# Patient Record
Sex: Female | Born: 1990
Health system: Southern US, Community
[De-identification: ages and names within clinical notes are randomized; demographics above are authoritative.]

## PROBLEM LIST (undated history)

## (undated) DIAGNOSIS — B019 Varicella without complication: Secondary | ICD-10-CM

## (undated) DIAGNOSIS — Z789 Other specified health status: Secondary | ICD-10-CM

## (undated) HISTORY — DX: Varicella without complication: B01.9

## (undated) HISTORY — DX: Other specified health status: Z78.9

---
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
## 2009-06-16 ENCOUNTER — Ambulatory Visit: Payer: Self-pay | Admitting: Otolaryngology

## 2015-03-04 ENCOUNTER — Encounter: Payer: Self-pay | Admitting: Family Medicine

## 2015-03-04 ENCOUNTER — Ambulatory Visit (INDEPENDENT_AMBULATORY_CARE_PROVIDER_SITE_OTHER): Payer: BLUE CROSS/BLUE SHIELD | Admitting: Family Medicine

## 2015-03-04 VITALS — BP 122/74 | HR 92 | Temp 98.5°F | Ht 64.25 in | Wt 197.8 lb

## 2015-03-04 DIAGNOSIS — K219 Gastro-esophageal reflux disease without esophagitis: Secondary | ICD-10-CM | POA: Diagnosis not present

## 2015-03-04 DIAGNOSIS — R0781 Pleurodynia: Secondary | ICD-10-CM | POA: Diagnosis not present

## 2015-03-04 DIAGNOSIS — Z13220 Encounter for screening for lipoid disorders: Secondary | ICD-10-CM

## 2015-03-04 DIAGNOSIS — G43909 Migraine, unspecified, not intractable, without status migrainosus: Secondary | ICD-10-CM | POA: Insufficient documentation

## 2015-03-04 DIAGNOSIS — Z130 Encounter for screening for diseases of the blood and blood-forming organs and certain disorders involving the immune mechanism: Secondary | ICD-10-CM | POA: Diagnosis not present

## 2015-03-04 DIAGNOSIS — Z0000 Encounter for general adult medical examination without abnormal findings: Secondary | ICD-10-CM

## 2015-03-04 DIAGNOSIS — G43709 Chronic migraine without aura, not intractable, without status migrainosus: Secondary | ICD-10-CM

## 2015-03-04 LAB — COMPREHENSIVE METABOLIC PANEL
ALK PHOS: 76 U/L (ref 39–117)
ALT: 12 U/L (ref 0–35)
AST: 13 U/L (ref 0–37)
Albumin: 4.4 g/dL (ref 3.5–5.2)
BILIRUBIN TOTAL: 1 mg/dL (ref 0.2–1.2)
BUN: 10 mg/dL (ref 6–23)
CO2: 26 mEq/L (ref 19–32)
CREATININE: 0.84 mg/dL (ref 0.40–1.20)
Calcium: 9.8 mg/dL (ref 8.4–10.5)
Chloride: 106 mEq/L (ref 96–112)
GFR: 87.21 mL/min (ref >60.00)
GLUCOSE: 77 mg/dL (ref 70–99)
Potassium: 3.8 mEq/L (ref 3.5–5.1)
SODIUM: 138 meq/L (ref 135–145)
TOTAL PROTEIN: 7.4 g/dL (ref 6.0–8.3)

## 2015-03-04 LAB — CBC
HCT: 42.4 % (ref 36.0–46.0)
HEMOGLOBIN: 14.3 g/dL (ref 12.0–15.0)
MCHC: 33.6 g/dL (ref 30.0–36.0)
MCV: 97.1 fl (ref 78.0–100.0)
Platelets: 325 10*3/uL (ref 150.0–400.0)
RBC: 4.36 Mil/uL (ref 3.87–5.11)
RDW: 12.6 % (ref 11.5–15.5)
WBC: 6.9 10*3/uL (ref 4.0–10.5)

## 2015-03-04 LAB — LIPID PANEL
Cholesterol: 152 mg/dL (ref 0–200)
HDL: 51 mg/dL (ref >39.00)
LDL Cholesterol: 92 mg/dL (ref 0–99)
NONHDL: 101.46
Total CHOL/HDL Ratio: 3
Triglycerides: 48 mg/dL (ref 0.0–149.0)
VLDL: 9.6 mg/dL (ref 0.0–40.0)

## 2015-03-04 NOTE — Assessment & Plan Note (Signed)
New problem. Well controlled with Prilosec. Will continue.

## 2015-03-04 NOTE — Progress Notes (Signed)
Subjective:  Patient ID: Debra Wood, female    DOB: April 14, 1990  Age: 24 y.o. MRN: [id number]  CC: Establish care; Rib pain, indigestion  HPI Debra Wood is a 24 y.o. female presents to the clinic today to establish care.  She also has complaints (see below).  Preventative Healthcare  Pap smear: 02/25/15.  Immunizations  Tetanus - 2009.  Pneumococcal - N/A.  Flu - Declined.  Labs: Screening labs today.  Exercise: Regular exercise.  Alcohol use: See below.  Smoking/tobacco use: Nonsmoker.  Regular dental exams: Yes.   Wears seat belt: Yes.  Indigestion  Patient reports recent heartburn and belching.  She reports relief with Prilosec.  No known exacerbating factors.  No associated nausea or vomiting.  No other associated symptoms.  No recent weight loss or other red flags.  Rib pain  Patient states for the past 2-3 weeks she's been experiencing intermittent pain located in the lower ribs.  She states that the pain alternates between right and left sides.  She reports that the pain results in shortness of breath (taking a deep breath worsens it).  Pain is severe and sharp in character.  No relieving factors.  No interventions tried.  She states that it lasts for approximately 1 hour and then resolve spontaneously.  No reported chest pain.  PMH, Surgical Hx, Family Hx, Social History reviewed and updated as below.  Past Medical History  Diagnosis Date  . Chicken pox   . Migraines    Past Surgical History  Procedure Laterality Date  . Tonsillectomy and adenoidectomy  2009   Family History  Problem Relation Age of Onset  . Breast cancer Maternal Grandmother   . Breast cancer Paternal Grandmother    Social History  Substance Use Topics  . Smoking status: Never Smoker   . Smokeless tobacco: Never Used  . Alcohol Use: 1.2 - 1.8 oz/week    2-3 Standard drinks or equivalent per week   Review of Systems  Respiratory: Positive  for shortness of breath.   Cardiovascular:       Chest wall pain - ribs.  All other systems reviewed and are negative.  Objective:   Today's Vitals: BP 122/74 mmHg  Pulse 92  Temp(Src) 98.5 F (36.9 C) (Oral)  Ht 5' 4.25" (1.632 m)  Wt 197 lb 12 oz (89.699 kg)  BMI 33.68 kg/m2  SpO2 98%  Physical Exam  Constitutional: She is oriented to person, place, and time. She appears well-developed and well-nourished. No distress.  HENT:  Head: Normocephalic and atraumatic.  Nose: Nose normal.  Mouth/Throat: Oropharynx is clear and moist. No oropharyngeal exudate.  Normal TM's bilaterally.   Eyes: Conjunctivae are normal. No scleral icterus.  Neck: Neck supple. No thyromegaly present.  Cardiovascular: Normal rate and regular rhythm.   No murmur heard. Pulmonary/Chest: Effort normal and breath sounds normal. She has no wheezes. She has no rales.  Abdominal: Soft. She exhibits no distension. There is no tenderness. There is no rebound and no guarding.  Musculoskeletal: Normal range of motion. She exhibits no edema.  Lymphadenopathy:    She has no cervical adenopathy.  Neurological: She is alert and oriented to person, place, and time.  Skin: Skin is warm and dry. No rash noted.  Psychiatric: She has a normal mood and affect.  Vitals reviewed.  Assessment & Plan:   Problem List Items Addressed This Visit    Rib pain    New problem.  MSK in nature. Exam unremarkable. Advised supportive care  and PRN NSAID's. Offered muscle relaxants and patient declined.      Relevant Orders   Comp Met (CMET) (Completed)   Preventative health care - Primary    Tetanus immunization up-to-date. Flu vaccine declined. Pap smear up-to-date. Screening lab work today - CBC, CMP, Lipid.       Migraine   GERD (gastroesophageal reflux disease)    New problem. Well controlled with Prilosec. Will continue.       Other Visit Diagnoses    Screening, lipid        Relevant Orders    Lipid panel  (Completed)    Screening for deficiency anemia        Relevant Orders    CBC (Completed)      Follow-up: 1 year or sooner if needed  [place]

## 2015-03-04 NOTE — Patient Instructions (Signed)
Continue the prilosec.  Let me know if the pains continue or worsen.  Nothing to be worried about currently.  Follow up annually.  Take care  Dr. Lacinda Axon

## 2015-03-04 NOTE — Assessment & Plan Note (Signed)
New problem.  MSK in nature. Exam unremarkable. Advised supportive care and PRN NSAID's. Offered muscle relaxants and patient declined.

## 2015-03-04 NOTE — Assessment & Plan Note (Addendum)
Tetanus immunization up-to-date. Flu vaccine declined. Pap smear up-to-date. Screening lab work today - CBC, CMP, Lipid.

## 2015-03-04 NOTE — Progress Notes (Signed)
Pre visit review using our clinic review tool, if applicable. No additional management support is needed unless otherwise documented below in the visit note. 

## 2015-04-16 ENCOUNTER — Ambulatory Visit (INDEPENDENT_AMBULATORY_CARE_PROVIDER_SITE_OTHER): Payer: BLUE CROSS/BLUE SHIELD | Admitting: Family Medicine

## 2015-04-16 ENCOUNTER — Encounter: Payer: Self-pay | Admitting: Family Medicine

## 2015-04-16 VITALS — BP 110/78 | HR 89 | Temp 98.7°F | Ht 64.25 in | Wt 202.5 lb

## 2015-04-16 DIAGNOSIS — E669 Obesity, unspecified: Secondary | ICD-10-CM | POA: Diagnosis not present

## 2015-04-16 MED ORDER — PHENTERMINE-TOPIRAMATE ER 3.75-23 MG PO CP24: ORAL_CAPSULE | ORAL | Status: DC

## 2015-04-16 MED ORDER — PHENTERMINE HCL 37.5 MG PO CAPS: 37.5000 mg | ORAL_CAPSULE | ORAL | Status: DC

## 2015-04-16 NOTE — Addendum Note (Signed)
Addended by: Coral Spikes on: 04/16/2015 05:02 PM   Modules accepted: Orders, Medications

## 2015-04-16 NOTE — Assessment & Plan Note (Addendum)
We discussed weight loss options today: Nutrition referral, Diet/exercise, weight loss medication. After discussion of risk/benefits, she elected to try medication. Rx for Phentermine.  Follow up in 1-2 months.

## 2015-04-16 NOTE — Progress Notes (Addendum)
   Subjective:  Patient ID: Debra Wood, female    DOB: Jul 22, 1990  Age: 25 y.o. MRN: [id number]  CC: Discuss weight loss  HPI:  25 year old female presents to discuss weight loss.  Obesity/Weight loss  Patient reports that she is unhappy with her current weight. She states she's gained weight secondary to underlying stressors and job changes.  She would like to lose approximately 50-60 lbs (down to 145-150).  She has been altering her diet and exercising recently (daily cardio; treadmill 30 mins at good pace on incline).   She is seen little to no results with the above interventions.  She states that she had good results with Adipex previously (was given by gyn). She states that she lost approximately 10 pounds in 30 days with this medication.  Patient like to discuss weight loss options today.  Social Hx   Social History   Social History  . Marital Status: Married    Spouse Name: N/A  . Number of Children: N/A  . Years of Education: N/A   Social History Main Topics  . Smoking status: Never Smoker   . Smokeless tobacco: Never Used  . Alcohol Use: 1.2 - 1.8 oz/week    2-3 Standard drinks or equivalent per week  . Drug Use: No  . Sexual Activity: Yes    Birth Control/ Protection: IUD   Other Topics Concern  . None   Social History Narrative   Review of Systems  Constitutional: Negative.   Cardiovascular: Negative.    Objective:  BP 110/78 mmHg  Pulse 89  Temp(Src) 98.7 F (37.1 C) (Oral)  Ht 5' 4.25" (1.632 m)  Wt 202 lb 8 oz (91.853 kg)  BMI 34.49 kg/m2  SpO2 99%  BP/Weight 04/16/2015 [id number]  Systolic BP [id number]  Diastolic BP 78 74  Wt. (Lbs) 202.5 197.75  BMI 34.49 33.68   Physical Exam  Constitutional: She is oriented to person, place, and time. She appears well-developed and well-nourished. No distress.  Eyes: No scleral icterus.  Pulmonary/Chest: Effort normal.  Neurological: She is alert and oriented to person, place, and time.    Psychiatric: She has a normal mood and affect.  Vitals reviewed.  Lab Results  Component Value Date   WBC 6.9 03/04/2015   HGB 14.3 03/04/2015   HCT 42.4 03/04/2015   PLT 325.0 03/04/2015   GLUCOSE 77 03/04/2015   CHOL 152 03/04/2015   TRIG 48.0 03/04/2015   HDL 51.00 03/04/2015   LDLCALC 92 03/04/2015   ALT 12 03/04/2015   AST 13 03/04/2015   NA 138 03/04/2015   K 3.8 03/04/2015   CL 106 03/04/2015   CREATININE 0.84 03/04/2015   BUN 10 03/04/2015   CO2 26 03/04/2015    Assessment & Plan:   Problem List Items Addressed This Visit    Obesity (BMI 30.0-34.9) - Primary    We discussed weight loss options today: Nutrition referral, Diet/exercise, weight loss medication. After discussion of risk/benefits, she elected to try medication. Rx for Phentermine.  Follow up in 1-2 months.      Relevant Medications   phentermine 37.5 MG capsule     Meds ordered this encounter  Medications  . phentermine 37.5 MG capsule    Sig: Take 1 capsule (37.5 mg total) by mouth every morning.    Dispense:  90 capsule    Refill:  0   Follow-up: 1-2 months  Thersa Salt DO [facility]

## 2015-04-16 NOTE — Progress Notes (Signed)
Pre visit review using our clinic review tool, if applicable. No additional management support is needed unless otherwise documented below in the visit note. 

## 2015-04-16 NOTE — Patient Instructions (Signed)
Take the medication as prescribed.  Follow up in 1-2 months.  Take care  Dr. Lacinda Axon

## 2015-06-25 ENCOUNTER — Ambulatory Visit: Payer: BLUE CROSS/BLUE SHIELD | Admitting: Family Medicine

## 2015-07-02 ENCOUNTER — Ambulatory Visit: Payer: BLUE CROSS/BLUE SHIELD | Admitting: Family Medicine

## 2015-07-23 ENCOUNTER — Ambulatory Visit: Payer: BLUE CROSS/BLUE SHIELD | Admitting: Family Medicine

## 2015-10-07 DIAGNOSIS — Z30432 Encounter for removal of intrauterine contraceptive device: Secondary | ICD-10-CM | POA: Diagnosis not present

## 2015-10-15 ENCOUNTER — Telehealth: Payer: Self-pay | Admitting: Family Medicine

## 2015-10-15 ENCOUNTER — Other Ambulatory Visit: Payer: Self-pay | Admitting: Family Medicine

## 2015-10-15 MED ORDER — FEXOFENADINE HCL 180 MG PO TABS: 180.0000 mg | ORAL_TABLET | Freq: Every day | ORAL | 3 refills | Status: DC

## 2015-10-15 NOTE — Telephone Encounter (Signed)
Pt would like to know if you could call in a rx for Allegra D. She said that it is expensive to buy over the counter and she can get a rx cheaper.

## 2015-10-15 NOTE — Telephone Encounter (Signed)
Rx sent 

## 2015-10-15 NOTE — Telephone Encounter (Signed)
Please advise, thanks.

## 2015-10-20 ENCOUNTER — Other Ambulatory Visit: Payer: Self-pay | Admitting: Family Medicine

## 2015-10-20 MED ORDER — FEXOFENADINE-PSEUDOEPHED ER 180-240 MG PO TB24: 1.0000 | ORAL_TABLET | Freq: Every day | ORAL | 0 refills | Status: DC

## 2015-10-20 NOTE — Telephone Encounter (Signed)
Pt states that when she went to pick up this rx that it was only Allegra. She wants to know if Allegra D can be called in so it has the decongestion.

## 2015-10-20 NOTE — Telephone Encounter (Signed)
New Rx sent.

## 2016-07-07 ENCOUNTER — Encounter: Payer: Self-pay | Admitting: Obstetrics & Gynecology

## 2016-10-16 ENCOUNTER — Ambulatory Visit (INDEPENDENT_AMBULATORY_CARE_PROVIDER_SITE_OTHER): Payer: BLUE CROSS/BLUE SHIELD | Admitting: Family Medicine

## 2016-10-16 ENCOUNTER — Encounter: Payer: Self-pay | Admitting: Family Medicine

## 2016-10-16 DIAGNOSIS — L255 Unspecified contact dermatitis due to plants, except food: Secondary | ICD-10-CM | POA: Diagnosis not present

## 2016-10-16 MED ORDER — CLOBETASOL PROP EMOLLIENT BASE 0.05 % EX CREA: 1.0000 "application " | TOPICAL_CREAM | Freq: Two times a day (BID) | CUTANEOUS | 0 refills | Status: DC

## 2016-10-16 NOTE — Patient Instructions (Signed)
Use it twice daily for 1 week.  Should take care of it.  Take care  Dr. Lacinda Axon    Poison Ivy Dermatitis Poison ivy dermatitis is inflammation of the skin that is caused by the allergens on the leaves of the poison ivy plant. The skin reaction often involves redness, swelling, blisters, and extreme itching. What are the causes? This condition is caused by a specific chemical (urushiol) found in the sap of the poison ivy plant. This chemical is sticky and can be easily spread to people, animals, and objects. You can get poison ivy dermatitis by:  Having direct contact with a poison ivy plant.  Touching animals, other people, or objects that have come in contact with poison ivy and have the chemical on them.  What increases the risk? This condition is more likely to develop in:  People who are outdoors often.  People who go outdoors without wearing protective clothing, such as closed shoes, long pants, and a long-sleeved shirt.  What are the signs or symptoms? Symptoms of this condition include:  Redness and itching.  A rash that often includes bumps and blisters. The rash usually appears 48 hours after exposure.  Swelling. This may occur if the reaction is more severe.  Symptoms usually last for 1-2 weeks. However, the first time you develop this condition, symptoms may last 3-4 weeks. How is this diagnosed? This condition may be diagnosed based on your symptoms and a physical exam. Your health care provider may also ask you about any recent outdoor activity. How is this treated? Treatment for this condition will vary depending on how severe it is. Treatment may include:  Hydrocortisone creams or calamine lotions to relieve itching.  Oatmeal baths to soothe the skin.  Over-the-counter antihistamine tablets.  Oral steroid medicine for more severe outbreaks.  Follow these instructions at home:  Take or apply over-the-counter and prescription medicines only as told by your  health care provider.  Wash exposed skin as soon as possible with soap and cold water.  Use hydrocortisone creams or calamine lotion as needed to soothe the skin and relieve itching.  Take oatmeal baths as needed. Use colloidal oatmeal. You can get this at your local pharmacy or grocery store. Follow the instructions on the packaging.  Do not scratch or rub your skin.  While you have the rash, wash clothes right after you wear them. How is this prevented?  Learn to identify the poison ivy plant and avoid contact with the plant. This plant can be recognized by the number of leaves. Generally, poison ivy has three leaves with flowering branches on a single stem. The leaves are typically glossy, and they have jagged edges that come to a point at the front.  If you have been exposed to poison ivy, thoroughly wash with soap and water right away. You have about 30 minutes to remove the plant resin before it will cause the rash. Be sure to wash under your fingernails because any plant resin there will continue to spread the rash.  When hiking or camping, wear clothes that will help you to avoid exposure on the skin. This includes long pants, a long-sleeved shirt, tall socks, and hiking boots. You can also apply preventive lotion to your skin to help limit exposure.  If you suspect that your clothes or outdoor gear came in contact with poison ivy, rinse them off outside with a garden hose before you bring them inside your house. Contact a health care provider if:  You  have open sores in the rash area.  You have more redness, swelling, or pain in the affected area.  You have redness that spreads beyond the rash area.  You have fluid, blood, or pus coming from the affected area.  You have a fever.  You have a rash over a large area of your body.  You have a rash on your eyes, mouth, or genitals.  Your rash does not improve after a few days. Get help right away if:  Your face swells or  your eyes swell shut.  You have trouble breathing.  You have trouble swallowing. This information is not intended to replace advice given to you by your health care provider. Make sure you discuss any questions you have with your health care provider. Document Released: 02/25/2000 Document Revised: 08/05/2015 Document Reviewed: 08/05/2014 Elsevier Interactive Patient Education  [employer].

## 2016-10-16 NOTE — Progress Notes (Signed)
Subjective:  Patient ID: Debra Wood, female    DOB: [birth year]  Age: 26 y.o. MRN: [id number]  CC: Rash  HPI:  26 year old female presents with the above complaint.  Patient states that she has had areas of rash on her lower legs for 2 weeks. She feels that she has poison ivy. She states she has had this previously and feels that is what she is having currently. Located on the medial legs. Red and intensely pruritic. She's had some drainage. Moderate in severity. She's used Benadryl and Caladryl without resolution. No other associated symptoms. No other complaints or concerns at this time.  Social Hx   Social History   Social History  . Marital status: Married    Spouse name: N/A  . Number of children: N/A  . Years of education: N/A   Occupational History  . customer service    Social History Main Topics  . Smoking status: Never Smoker  . Smokeless tobacco: Never Used  . Alcohol use 1.2 - 1.8 oz/week    2 - 3 Standard drinks or equivalent per week  . Drug use: No  . Sexual activity: Yes    Birth control/ protection: IUD   Other Topics Concern  . None   Social History Narrative  . None    Review of Systems  Constitutional: Negative.   Skin: Positive for rash.   Objective:  BP 110/70 (BP Location: Left Arm, Patient Position: Sitting, Cuff Size: Normal)   Pulse 88   Temp 98.8 F (37.1 C) (Oral)   Wt 192 lb (87.1 kg)   SpO2 98%   BMI 32.70 kg/m   BP/Weight 10/16/2016 04/16/2015 43/15/4008  Systolic BP 676 195 093  Diastolic BP 70 78 74  Wt. (Lbs) 192 202.5 197.75  BMI 32.7 34.49 33.68   Physical Exam  Constitutional: She is oriented to person, place, and time. She appears well-developed. No distress.  HENT:  Head: Normocephalic and atraumatic.  Eyes: Conjunctivae are normal. No scleral icterus.  Pulmonary/Chest: Effort normal. No respiratory distress.  Neurological: She is alert and oriented to person, place, and time.  Skin:  Raised patches of  erythema on the medial legs. No drainage.  Psychiatric: She has a normal mood and affect.  Vitals reviewed.   Lab Results  Component Value Date   WBC 6.9 03/04/2015   HGB 14.3 03/04/2015   HCT 42.4 03/04/2015   PLT 325.0 03/04/2015   GLUCOSE 77 03/04/2015   CHOL 152 03/04/2015   TRIG 48.0 03/04/2015   HDL 51.00 03/04/2015   LDLCALC 92 03/04/2015   ALT 12 03/04/2015   AST 13 03/04/2015   NA 138 03/04/2015   K 3.8 03/04/2015   CL 106 03/04/2015   CREATININE 0.84 03/04/2015   BUN 10 03/04/2015   CO2 26 03/04/2015    Assessment & Plan:   Problem List Items Addressed This Visit    Dermatitis due to plants, including poison ivy, sumac, and oak    New problem. Treating with topical clobetasol         Meds ordered this encounter  Medications  . norgestimate-ethinyl estradiol (ORTHO-CYCLEN,SPRINTEC,PREVIFEM) 0.25-35 MG-MCG tablet    Sig: Take 1 tablet by mouth daily.  . fexofenadine (ALLEGRA) 30 MG tablet    Sig: Take 30 mg by mouth 2 (two) times daily.  . Clobetasol Prop Emollient Base (CLOBETASOL PROPIONATE E) 0.05 % emollient cream    Sig: Apply 1 application topically 2 (two) times daily.    Dispense:  30  g    Refill:  0     Follow-up: PRN  [place]

## 2016-10-16 NOTE — Assessment & Plan Note (Signed)
New problem. Treating with topical clobetasol

## 2016-10-24 ENCOUNTER — Other Ambulatory Visit: Payer: Self-pay | Admitting: Family Medicine

## 2016-11-02 DIAGNOSIS — R229 Localized swelling, mass and lump, unspecified: Secondary | ICD-10-CM | POA: Diagnosis not present

## 2017-01-08 ENCOUNTER — Ambulatory Visit (INDEPENDENT_AMBULATORY_CARE_PROVIDER_SITE_OTHER): Payer: BLUE CROSS/BLUE SHIELD | Admitting: Family

## 2017-01-08 ENCOUNTER — Encounter: Payer: Self-pay | Admitting: Family

## 2017-01-08 VITALS — BP 130/70 | HR 78 | Temp 98.0°F | Ht 64.0 in | Wt 191.0 lb

## 2017-01-08 DIAGNOSIS — R300 Dysuria: Secondary | ICD-10-CM | POA: Diagnosis not present

## 2017-01-08 DIAGNOSIS — J302 Other seasonal allergic rhinitis: Secondary | ICD-10-CM

## 2017-01-08 LAB — POCT URINALYSIS DIPSTICK
Blood, UA: NEGATIVE
Glucose, UA: NEGATIVE
NITRITE UA: NEGATIVE
PH UA: 6 (ref 5.0–8.0)
SPEC GRAV UA: 1.02 (ref 1.010–1.025)
Urobilinogen, UA: 1 E.U./dL

## 2017-01-08 LAB — URINALYSIS, MICROSCOPIC ONLY

## 2017-01-08 MED ORDER — FEXOFENADINE-PSEUDOEPHED ER 180-240 MG PO TB24: 1.0000 | ORAL_TABLET | Freq: Every day | ORAL | 1 refills | Status: DC

## 2017-01-08 NOTE — Progress Notes (Signed)
Subjective:    Patient ID: Debra Wood, female    DOB: 16-Mar-1990, 26 y.o.   MRN: [id number]  CC: Debra Wood is a 26 y.o. female who presents today for an acute visit.    HPI: CC: dysuria, urgency x one week, unchanged  No fever, N, V  Tried azo with no relief.   On OCP. no changes in vaginal discharge. No recent UTI   Would like refill of Allegra-D for allergies.no chest pain, palpitations    HISTORY:  Past Medical History:  Diagnosis Date  . Chicken pox   . Migraines    Past Surgical History:  Procedure Laterality Date  . TONSILLECTOMY AND ADENOIDECTOMY  2009   Family History  Problem Relation Age of Onset  . Breast cancer Maternal Grandmother   . Breast cancer Paternal Grandmother     Allergies: Patient has no known allergies. Current Outpatient Prescriptions on File Prior to Visit  Medication Sig Dispense Refill  . Clobetasol Prop Emollient Base (CLOBETASOL PROPIONATE E) 0.05 % emollient cream Apply 1 application topically 2 (two) times daily. 30 g 0  . norgestimate-ethinyl estradiol (ORTHO-CYCLEN,SPRINTEC,PREVIFEM) 0.25-35 MG-MCG tablet Take 1 tablet by mouth daily.     No current facility-administered medications on file prior to visit.     Social History  Substance Use Topics  . Smoking status: Never Smoker  . Smokeless tobacco: Never Used  . Alcohol use 1.2 - 1.8 oz/week    2 - 3 Standard drinks or equivalent per week    Review of Systems  Constitutional: Negative for chills and fever.  Respiratory: Negative for cough.   Cardiovascular: Negative for chest pain and palpitations.  Gastrointestinal: Negative for nausea and vomiting.  Genitourinary: Positive for dysuria and urgency. Negative for flank pain, frequency, hematuria and vaginal discharge.      Objective:    BP 130/70   Pulse 78   Temp 98 F (36.7 C) (Oral)   Ht 5\' 4"  (1.626 m)   Wt 191 lb (86.6 kg)   SpO2 96%   BMI 32.79 kg/m    Physical Exam  Constitutional: She  appears well-developed and well-nourished.  Cardiovascular: Normal rate, regular rhythm, normal heart sounds and normal pulses.   Pulmonary/Chest: Effort normal and breath sounds normal. She has no wheezes. She has no rhonchi. She has no rales.  Abdominal: There is no CVA tenderness.  Neurological: She is alert.  Skin: Skin is warm and dry.  Psychiatric: She has a normal mood and affect. Her speech is normal and behavior is normal. Thought content normal.  Vitals reviewed.      Assessment & Plan:      I have discontinued Ms. Sleeper's fexofenadine and fexofenadine. I am also having her start on fexofenadine-pseudoephedrine. Additionally, I am having her maintain her norgestimate-ethinyl estradiol and Clobetasol Prop Emollient Base.   Meds ordered this encounter  Medications  . fexofenadine-pseudoephedrine (ALLEGRA-D 24) 180-240 MG 24 hr tablet    Sig: Take 1 tablet by mouth daily.    Dispense:  90 tablet    Refill:  1    Order Specific Question:   Supervising Provider    Answer:   Crecencio Mc [[id number]]    Return precautions given.   Risks, benefits, and alternatives of the medications and treatment plan prescribed today were discussed, and patient expressed understanding.   Education regarding symptom management and diagnosis given to patient on AVS.  Continue to follow with Burnard Hawthorne, FNP for routine health maintenance.  Cherlyn Roberts and I agreed with plan.   Mable Paris, FNP

## 2017-01-08 NOTE — Progress Notes (Signed)
Pre visit review using our clinic review tool, if applicable. No additional management support is needed unless otherwise documented below in the visit note. 

## 2017-01-08 NOTE — Assessment & Plan Note (Signed)
Urine dip positive for leukocytes, negative for nitrites, blood. Patient agreeable to wait on urine culture.

## 2017-01-08 NOTE — Assessment & Plan Note (Signed)
control Allegra-D. Refilled

## 2017-01-08 NOTE — Patient Instructions (Addendum)
Let's await on urine culture  Plenty of water  Allegra-D refilled. Please ensure you do not have any palpitations or anxiety on decongestant as can be quite stimulating.

## 2017-01-09 LAB — URINE CULTURE
MICRO NUMBER:: 81209420
SPECIMEN QUALITY:: ADEQUATE

## 2017-01-10 ENCOUNTER — Other Ambulatory Visit: Payer: Self-pay

## 2017-01-10 ENCOUNTER — Telehealth: Payer: Self-pay | Admitting: Family

## 2017-01-10 DIAGNOSIS — J302 Other seasonal allergic rhinitis: Secondary | ICD-10-CM

## 2017-01-10 MED ORDER — FEXOFENADINE-PSEUDOEPHED ER 180-240 MG PO TB24: 1.0000 | ORAL_TABLET | Freq: Every day | ORAL | 1 refills | Status: DC

## 2017-01-10 NOTE — Telephone Encounter (Signed)
Allegra has been sent. Please advise. On other rx.

## 2017-01-10 NOTE — Telephone Encounter (Signed)
Please call pt- Per urine culture, she doesn't have UTI. Per chart, you spoke with her about this?  Does she still have urinary symptoms ?  If so, I would advise rechecking urine and pyridium for discomfort.  Let me know what her symptoms are.

## 2017-01-10 NOTE — Telephone Encounter (Signed)
Pt would like a generic allegra called into her pharmacy. She said the name brand is $150. She also would like an antibiotic sent for her UTI. She said she was told that if it doesn't get better to call and something would be sent in for her. Pt cb [phone]

## 2017-01-11 ENCOUNTER — Other Ambulatory Visit: Payer: Self-pay

## 2017-01-11 NOTE — Telephone Encounter (Signed)
She stated sx are better.

## 2017-02-12 DIAGNOSIS — S63501A Unspecified sprain of right wrist, initial encounter: Secondary | ICD-10-CM | POA: Diagnosis not present

## 2017-04-19 ENCOUNTER — Telehealth: Payer: Self-pay | Admitting: Family

## 2017-04-19 NOTE — Telephone Encounter (Signed)
See message below Tried calling patient back to see if she has tried anything OTC, left message

## 2017-04-19 NOTE — Telephone Encounter (Signed)
Copied from [place]. Topic: Quick Communication - See Telephone Encounter >> Apr 19, 2017 11:52 AM Boyd Kerbs wrote: CRM for notification. See Telephone encounter for:   Called saying co-workers have pink eye and she woke up today with pink and sticky and goopy. She said it in itching and bothering her Wanting to see if can call something in for her.   [facility] [place], [place] - [address] [place] [address] Phone: [phone] Fax: [phone]    04/19/17.

## 2017-04-19 NOTE — Telephone Encounter (Signed)
Pt states her right eye began to water,and became red with itching last night. Upon waking up this morning the pt states her eye was stuck shut and "goop" was around her eye. Pt states that some of her coworkers were diagnosed with pink eye last week. Informed pt that she would need to be seen in for an appt, but the pt states she is unable to come in due to having to work two jobs and not having the time. Informed pt that Urgent Care was also an option to seek treatment. Pt asking if medication could be called in to [facility] in [place] on [address].

## 2017-04-19 NOTE — Telephone Encounter (Signed)
Agree with need for evaluation.  Acute care open until 7:00.

## 2017-04-24 NOTE — Telephone Encounter (Signed)
Patient advised of below 28/19

## 2017-07-03 DIAGNOSIS — M25551 Pain in right hip: Secondary | ICD-10-CM | POA: Diagnosis not present

## 2017-07-03 DIAGNOSIS — M7061 Trochanteric bursitis, right hip: Secondary | ICD-10-CM | POA: Diagnosis not present

## 2017-07-04 DIAGNOSIS — M7061 Trochanteric bursitis, right hip: Secondary | ICD-10-CM | POA: Insufficient documentation

## 2017-07-17 DIAGNOSIS — M25551 Pain in right hip: Secondary | ICD-10-CM | POA: Diagnosis not present

## 2017-07-17 DIAGNOSIS — M7061 Trochanteric bursitis, right hip: Secondary | ICD-10-CM | POA: Diagnosis not present

## 2017-12-22 ENCOUNTER — Other Ambulatory Visit: Payer: Self-pay | Admitting: Family Medicine

## 2018-02-13 ENCOUNTER — Ambulatory Visit: Payer: BLUE CROSS/BLUE SHIELD | Admitting: Family

## 2018-02-13 ENCOUNTER — Ambulatory Visit (INDEPENDENT_AMBULATORY_CARE_PROVIDER_SITE_OTHER): Payer: BLUE CROSS/BLUE SHIELD

## 2018-02-13 ENCOUNTER — Encounter: Payer: Self-pay | Admitting: Family

## 2018-02-13 VITALS — BP 116/70 | HR 83 | Temp 98.6°F | Wt 208.0 lb

## 2018-02-13 DIAGNOSIS — M79671 Pain in right foot: Secondary | ICD-10-CM | POA: Diagnosis not present

## 2018-02-13 DIAGNOSIS — M25562 Pain in left knee: Secondary | ICD-10-CM

## 2018-02-13 DIAGNOSIS — M79672 Pain in left foot: Secondary | ICD-10-CM

## 2018-02-13 NOTE — Progress Notes (Signed)
Subjective:    Patient ID: Debra Wood, female    DOB: 1990/08/23, 27 y.o.   MRN: [id number]  CC: Genisis Wood is a 27 y.o. female who presents today for follow up.   HPI: Complains of left posterior knee pain,  4 months, unchangd.  Feels like swelling behind left knee, painful to bend.   Has had an episode of right leg numbness for 30-40 minutes and then resolved on its. Typically does not have any numbness in legs. No falls or known injury. Doesn't work on knees.   Works on Armed forces training and education officer.  Wears boots to work.  Also complains of bilateral foot pain on soles of feet, couple of months, with improvement.    Noted that soles of feet would bother her when getting out of bed in themorning. Added dr scholls soles which has helped. Wear compression stockings due to swelling at end of day around ankles.   She also complains of hair loss. 'Feels like hair is also falling out more.'  No clumps. Endorses dry skin.   No vision changes.   Overdue pap smear- sees OB GYN , [facility].   On OCP- cycles are irregular. Cramping and heaviness improved on OCP. Trouble conceiving over a year ago, not trying to concieve currently therefore resume OCP.      HISTORY:  Past Medical History:  Diagnosis Date  . Chicken pox   . Migraines    Past Surgical History:  Procedure Laterality Date  . TONSILLECTOMY AND ADENOIDECTOMY  2009   Family History  Problem Relation Age of Onset  . Breast cancer Maternal Grandmother   . Breast cancer Paternal Grandmother     Allergies: Patient has no known allergies. Current Outpatient Medications on File Prior to Visit  Medication Sig Dispense Refill  . Clobetasol Prop Emollient Base (CLOBETASOL PROPIONATE E) 0.05 % emollient cream Apply 1 application topically 2 (two) times daily. 30 g 0  . fexofenadine (ALLEGRA) 180 MG tablet TAKE 1 TABLET (180 MG TOTAL) BY MOUTH DAILY. 90 tablet 2  . fexofenadine-pseudoephedrine (ALLEGRA-D 24) 180-240 MG 24 hr  tablet Take 1 tablet by mouth daily. 90 tablet 1  . norgestimate-ethinyl estradiol (ORTHO-CYCLEN,SPRINTEC,PREVIFEM) 0.25-35 MG-MCG tablet Take 1 tablet by mouth daily.     No current facility-administered medications on file prior to visit.     Social History   Tobacco Use  . Smoking status: Never Smoker  . Smokeless tobacco: Never Used  Substance Use Topics  . Alcohol use: Yes    Alcohol/week: 2.0 - 3.0 standard drinks    Types: 2 - 3 Standard drinks or equivalent per week  . Drug use: No    Review of Systems  Constitutional: Negative for chills and fever.  Respiratory: Negative for cough.   Cardiovascular: Negative for chest pain and palpitations.  Gastrointestinal: Negative for nausea and vomiting.  Musculoskeletal: Positive for arthralgias (left knee) and joint swelling (posterior left knee).      Objective:    BP 116/70 (BP Location: Left Arm, Patient Position: Sitting, Cuff Size: Large)   Pulse 83   Temp 98.6 F (37 C)   Wt 208 lb (94.3 kg)   SpO2 98%   BMI 35.70 kg/m  BP Readings from Last 3 Encounters:  02/13/18 116/70  01/08/17 130/70  10/16/16 110/70   Wt Readings from Last 3 Encounters:  02/13/18 208 lb (94.3 kg)  01/08/17 191 lb (86.6 kg)  10/16/16 192 lb (87.1 kg)    Physical Exam  Constitutional: She  appears well-developed and well-nourished.  Eyes: Conjunctivae are normal.  Cardiovascular: Normal rate, regular rhythm, normal heart sounds and normal pulses.  Pulmonary/Chest: Effort normal and breath sounds normal. She has no wheezes. She has no rhonchi. She has no rales.  Musculoskeletal:       Right knee: She exhibits normal range of motion and no swelling. No tenderness found.       Left knee: She exhibits decreased range of motion. She exhibits no swelling. No tenderness found.       Legs:      Right foot: There is normal range of motion, no tenderness, no bony tenderness and no swelling.       Left foot: There is normal range of motion, no  tenderness and no bony tenderness.  Subtle area of asymetry noted left posterior knee. No increase warmth, erythema. Non fluctuant.  .  Bilateral knees are symmetric. No effusion appreciated. No increase in warmth or erythema. Crepitus felt with flexion of bilateral knees.  Left  knee:   Tenderness with flexion of left knee. Able to fully extend. No catching with McMurray maneuver. No patellar apprehension. Negative anterior drawer and lachman's- no laxity appreciated.  No calf tenderness.  Trace non pitting lower leg edema bilaterally.   Bilateral feet: tenderness of soles of feet noted with plantar flexion. No edema, erythema. Skin intact   Neurological: She is alert.  Skin: Skin is warm and dry.  Psychiatric: She has a normal mood and affect. Her speech is normal and behavior is normal. Thought content normal.  Vitals reviewed.      Assessment & Plan:   Problem List Items Addressed This Visit      Other   Acute pain of left knee - Primary    Pain and assessment support extra-articular source of pain.  Working diagnosis of possible Baker cyst.  Pending left x-ray.  Advised conservative therapy with ice, short course of meloxicam, neoprene sleeve.  If no improvement, patient will let me know we will consult orthopedics.      Relevant Orders   DG Knee Complete 4 Views Left (Completed)   Bilateral foot pain    Working diagnosis of plantar fasciitis.  Pleased that she is already had some improvement.  We jointly agreed we would trial conservative therapy with icing regimen, short course of meloxicam.  If no improvement patient will let me know and we will consult podiatry.      Relevant Orders   CBC with Differential/Platelet (Completed)   TSH (Completed)   Comprehensive metabolic panel (Completed)   Hemoglobin A1c (Completed)   B12 and Folate Panel (Completed)       I am having Erasmo Downer Franze maintain her norgestimate-ethinyl estradiol, Clobetasol Prop Emollient Base,  fexofenadine-pseudoephedrine, and fexofenadine.   No orders of the defined types were placed in this encounter.   Return precautions given.   Risks, benefits, and alternatives of the medications and treatment plan prescribed today were discussed, and patient expressed understanding.   Education regarding symptom management and diagnosis given to patient on AVS.  Continue to follow with Burnard Hawthorne, FNP for routine health maintenance.   Cherlyn Roberts and I agreed with plan.   Mable Paris, FNP

## 2018-02-13 NOTE — Patient Instructions (Addendum)
Call [facility] to schedule follow up - discussion of irregular cycles and have physical, pap smear done.    Suspect plantar fascitis-  ice and mobic as needed.   May take Mobic ( meloxicam) with FOOD since it is an anti-inflammatory. This should be thought of as a temporary medication - around 3 months  If pain has resolved at that time, we need to consult orthopedics/podiatry.

## 2018-02-14 DIAGNOSIS — M25562 Pain in left knee: Secondary | ICD-10-CM | POA: Insufficient documentation

## 2018-02-14 DIAGNOSIS — M79671 Pain in right foot: Secondary | ICD-10-CM | POA: Insufficient documentation

## 2018-02-14 DIAGNOSIS — M79672 Pain in left foot: Secondary | ICD-10-CM

## 2018-02-14 LAB — COMPREHENSIVE METABOLIC PANEL
ALT: 16 U/L (ref 0–35)
AST: 14 U/L (ref 0–37)
Albumin: 4.2 g/dL (ref 3.5–5.2)
Alkaline Phosphatase: 72 U/L (ref 39–117)
BILIRUBIN TOTAL: 0.5 mg/dL (ref 0.2–1.2)
BUN: 10 mg/dL (ref 6–23)
CO2: 26 mEq/L (ref 19–32)
CREATININE: 0.94 mg/dL (ref 0.40–1.20)
Calcium: 9.1 mg/dL (ref 8.4–10.5)
Chloride: 104 mEq/L (ref 96–112)
GFR: 74.93 mL/min (ref >60.00)
Glucose, Bld: 91 mg/dL (ref 70–99)
Potassium: 4.2 mEq/L (ref 3.5–5.1)
SODIUM: 138 meq/L (ref 135–145)
Total Protein: 7.3 g/dL (ref 6.0–8.3)

## 2018-02-14 LAB — CBC WITH DIFFERENTIAL/PLATELET
BASOS PCT: 0.7 % (ref 0.0–3.0)
Basophils Absolute: 0.1 10*3/uL (ref 0.0–0.1)
EOS ABS: 0.2 10*3/uL (ref 0.0–0.7)
Eosinophils Relative: 1.8 % (ref 0.0–5.0)
HEMATOCRIT: 41.3 % (ref 36.0–46.0)
Hemoglobin: 14.1 g/dL (ref 12.0–15.0)
LYMPHS ABS: 3.1 10*3/uL (ref 0.7–4.0)
LYMPHS PCT: 34.9 % (ref 12.0–46.0)
MCHC: 34.2 g/dL (ref 30.0–36.0)
MCV: 95.4 fl (ref 78.0–100.0)
MONOS PCT: 6.3 % (ref 3.0–12.0)
Monocytes Absolute: 0.6 10*3/uL (ref 0.1–1.0)
Neutro Abs: 5 10*3/uL (ref 1.4–7.7)
Neutrophils Relative %: 56.3 % (ref 43.0–77.0)
PLATELETS: 352 10*3/uL (ref 150.0–400.0)
RBC: 4.33 Mil/uL (ref 3.87–5.11)
RDW: 12.6 % (ref 11.5–15.5)
WBC: 8.8 10*3/uL (ref 4.0–10.5)

## 2018-02-14 LAB — TSH: TSH: 0.37 u[IU]/mL (ref 0.35–4.50)

## 2018-02-14 LAB — B12 AND FOLATE PANEL
FOLATE: 23.2 ng/mL (ref >5.9)
Vitamin B-12: 439 pg/mL (ref 211–911)

## 2018-02-14 LAB — HEMOGLOBIN A1C: Hgb A1c MFr Bld: 4.9 % (ref 4.6–6.5)

## 2018-02-14 NOTE — Assessment & Plan Note (Signed)
Working diagnosis of plantar fasciitis.  Pleased that she is already had some improvement.  We jointly agreed we would trial conservative therapy with icing regimen, short course of meloxicam.  If no improvement patient will let me know and we will consult podiatry.

## 2018-02-14 NOTE — Assessment & Plan Note (Signed)
Pain and assessment support extra-articular source of pain.  Working diagnosis of possible Baker cyst.  Pending left x-ray.  Advised conservative therapy with ice, short course of meloxicam, neoprene sleeve.  If no improvement, patient will let me know we will consult orthopedics.

## 2018-02-28 ENCOUNTER — Ambulatory Visit (INDEPENDENT_AMBULATORY_CARE_PROVIDER_SITE_OTHER): Payer: BLUE CROSS/BLUE SHIELD | Admitting: Obstetrics and Gynecology

## 2018-02-28 ENCOUNTER — Encounter: Payer: Self-pay | Admitting: Obstetrics and Gynecology

## 2018-02-28 ENCOUNTER — Other Ambulatory Visit (HOSPITAL_COMMUNITY)
Admission: RE | Admit: 2018-02-28 | Discharge: 2018-02-28 | Disposition: A | Discharge status: Home / Self Care | Payer: BLUE CROSS/BLUE SHIELD | Source: Ambulatory Visit | Attending: Obstetrics and Gynecology | Admitting: Obstetrics and Gynecology

## 2018-02-28 VITALS — BP 131/76 | HR 84 | Ht 64.0 in | Wt 208.0 lb

## 2018-02-28 DIAGNOSIS — R635 Abnormal weight gain: Secondary | ICD-10-CM | POA: Diagnosis not present

## 2018-02-28 DIAGNOSIS — Z1239 Encounter for other screening for malignant neoplasm of breast: Secondary | ICD-10-CM

## 2018-02-28 DIAGNOSIS — Z01419 Encounter for gynecological examination (general) (routine) without abnormal findings: Secondary | ICD-10-CM

## 2018-02-28 DIAGNOSIS — N939 Abnormal uterine and vaginal bleeding, unspecified: Secondary | ICD-10-CM | POA: Diagnosis not present

## 2018-02-28 DIAGNOSIS — Z124 Encounter for screening for malignant neoplasm of cervix: Secondary | ICD-10-CM | POA: Insufficient documentation

## 2018-02-28 NOTE — Progress Notes (Signed)
Gynecology Annual Exam   PCP: Burnard Hawthorne, FNP  Chief Complaint:  Chief Complaint  Patient presents with  . Gynecologic Exam    DIscuss PCOS    History of Present Illness: Patient is a 27 y.o. No obstetric history on file. presents for annual exam. The patient has no complaints today.   LMP: Patient's last menstrual period was 02/13/2018 (exact date). Average Interval: regular, 28 days Duration of flow: 5 days Heavy Menses: no Clots: no Intermenstrual Bleeding: no Postcoital Bleeding: no Dysmenorrhea: no  The patient is sexually active. She currently uses OCP (estrogen/progesterone) for contraception. She denies dyspareunia.  There is no notable family history of breast or ovarian cancer in her family.  The patient wears seatbelts: yes.   The patient has regular exercise: not asked.    The patient denies current symptoms of depression.    Review of Systems: Review of Systems  Constitutional: Positive for malaise/fatigue. Negative for chills, fever and weight loss.  HENT: Negative for congestion.   Respiratory: Negative for cough and shortness of breath.   Cardiovascular: Negative for chest pain and palpitations.  Gastrointestinal: Negative for abdominal pain, constipation, diarrhea, heartburn, nausea and vomiting.  Genitourinary: Negative for dysuria, frequency and urgency.  Skin: Negative for itching and rash.  Neurological: Negative for dizziness and headaches.  Endo/Heme/Allergies: Negative for polydipsia.  Psychiatric/Behavioral: Negative for depression.    Past Medical History:  Past Medical History:  Diagnosis Date  . Chicken pox   . No known health problems     Past Surgical History:  Past Surgical History:  Procedure Laterality Date  . TONSILLECTOMY AND ADENOIDECTOMY  2009    Gynecologic History:  Patient's last menstrual period was 02/13/2018 (exact date). Contraception: OCP (estrogen/progesterone)   Obstetric History: No obstetric  history on file.  Family History:  Family History  Problem Relation Age of Onset  . Breast cancer Maternal Grandmother        40's-50's pt unsure  . Breast cancer Paternal Grandmother 72       had 2 seperate times    Social History:  Social History   Socioeconomic History  . Marital status: Married    Spouse name: Not on file  . Number of children: Not on file  . Years of education: Not on file  . Highest education level: Not on file  Occupational History  . Occupation: Therapist, art  Social Needs  . Financial resource strain: Not on file  . Food insecurity:    Worry: Not on file    Inability: Not on file  . Transportation needs:    Medical: Not on file    Non-medical: Not on file  Tobacco Use  . Smoking status: Never Smoker  . Smokeless tobacco: Never Used  Substance and Sexual Activity  . Alcohol use: Yes    Alcohol/week: 2.0 - 3.0 standard drinks    Types: 2 - 3 Standard drinks or equivalent per week  . Drug use: No  . Sexual activity: Yes    Birth control/protection: Pill  Lifestyle  . Physical activity:    Days per week: Not on file    Minutes per session: Not on file  . Stress: Not on file  Relationships  . Social connections:    Talks on phone: Not on file    Gets together: Not on file    Attends religious service: Not on file    Active member of club or organization: Not on file  Attends meetings of clubs or organizations: Not on file    Relationship status: Not on file  . Intimate partner violence:    Fear of current or ex partner: Not on file    Emotionally abused: Not on file    Physically abused: Not on file    Forced sexual activity: Not on file  Other Topics Concern  . 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 Authorizing Provider  norgestimate-ethinyl estradiol  (ORTHO-CYCLEN,SPRINTEC,PREVIFEM) 0.25-35 MG-MCG tablet Take 1 tablet by mouth daily.   Yes [provider]    Physical Exam Vitals: Blood pressure 131/76, pulse 84, height 5\' 4"  (1.626 m), weight 208 lb (94.3 kg), last menstrual period 02/13/2018.  General: NAD HEENT: normocephalic, anicteric Thyroid: no enlargement, no palpable nodules Pulmonary: No increased work of breathing, CTAB Cardiovascular: RRR, distal pulses 2+ Breast: Breast symmetrical, no tenderness, no palpable nodules or masses, no skin or nipple retraction present, no nipple discharge.  No axillary or supraclavicular lymphadenopathy. Abdomen: NABS, soft, non-tender, non-distended.  Umbilicus without lesions.  No hepatomegaly, splenomegaly or masses palpable. No evidence of hernia  Genitourinary:  External: Normal external female genitalia.  Normal urethral meatus, normal Bartholin's and Skene's glands.    Vagina: Normal vaginal mucosa, no evidence of prolapse.    Cervix: Grossly normal in appearance, no bleeding  Uterus: Non-enlarged, mobile, normal contour.  No CMT  Adnexa: ovaries non-enlarged, no adnexal masses  Rectal: deferred  Lymphatic: no evidence of inguinal lymphadenopathy Extremities: no edema, erythema, or tenderness Neurologic: Grossly intact Psychiatric: mood appropriate, affect full  Female chaperone present for pelvic and breast  portions of the physical exam    Assessment: 27 y.o. No obstetric history on file. routine annual exam  Plan: Problem List Items Addressed This Visit    None    Visit Diagnoses    Encounter for gynecological examination without abnormal finding    -  Primary   Screening for malignant neoplasm of cervix       Relevant Orders   Cytology - PAP (Completed)   Breast screening       Weight gain       Relevant Orders   TSH+Prl+FSH+TestT+LH+DHEA S... (Completed)   US Transvaginal Non-OB   Abnormal uterine bleeding       Relevant Orders    TSH+Prl+FSH+TestT+LH+DHEA S... (Completed)   US Transvaginal Non-OB     1) Irregular menses off OCP - has tried conceiving in past unsuccessfully.  Would like to be checked for PCOS  2) STI screening  was notoffered and therefore not obtained  2)  ASCCP guidelines and rational discussed.  Patient opts for every 3 years screening interval  3) Contraception - the patient is currently using  OCP (estrogen/progesterone).  She is happy with her current form of contraception and plans to continue  4) Routine healthcare maintenance including cholesterol, diabetes screening discussed managed by PCP  5) Return in about 1 week (around 03/07/2018) for 1-3 week TVUS and follow up.   Malachy Mood, MD, [facility], [facility] 02/28/2018, 3:03 PM

## 2018-03-04 LAB — CYTOLOGY - PAP: Diagnosis: NEGATIVE

## 2018-03-05 LAB — TSH+PRL+FSH+TESTT+LH+DHEA S...
17-Hydroxyprogesterone: <10 ng/dL
Androstenedione: 49 ng/dL (ref 41–262)
DHEA SO4: 99.3 ug/dL (ref 84.8–378.0)
FSH: 5.6 m[IU]/mL
LH: 7 m[IU]/mL
Prolactin: 33.6 ng/mL — ABNORMAL HIGH (ref 4.8–23.3)
TSH: 0.305 u[IU]/mL — ABNORMAL LOW (ref 0.450–4.500)
Testosterone, Free: 0.6 pg/mL (ref 0.0–4.2)
Testosterone: 6 ng/dL — ABNORMAL LOW (ref 8–48)

## 2018-03-25 ENCOUNTER — Telehealth: Payer: Self-pay | Admitting: Family

## 2018-03-25 ENCOUNTER — Encounter: Payer: Self-pay | Admitting: Obstetrics and Gynecology

## 2018-03-25 ENCOUNTER — Ambulatory Visit (INDEPENDENT_AMBULATORY_CARE_PROVIDER_SITE_OTHER): Payer: BLUE CROSS/BLUE SHIELD | Admitting: Obstetrics and Gynecology

## 2018-03-25 ENCOUNTER — Ambulatory Visit (INDEPENDENT_AMBULATORY_CARE_PROVIDER_SITE_OTHER): Payer: BLUE CROSS/BLUE SHIELD

## 2018-03-25 VITALS — BP 130/76 | HR 100 | Wt 206.0 lb

## 2018-03-25 DIAGNOSIS — N939 Abnormal uterine and vaginal bleeding, unspecified: Secondary | ICD-10-CM | POA: Diagnosis not present

## 2018-03-25 DIAGNOSIS — R635 Abnormal weight gain: Secondary | ICD-10-CM | POA: Diagnosis not present

## 2018-03-25 DIAGNOSIS — R7989 Other specified abnormal findings of blood chemistry: Secondary | ICD-10-CM

## 2018-03-25 DIAGNOSIS — E669 Obesity, unspecified: Secondary | ICD-10-CM

## 2018-03-25 DIAGNOSIS — Z23 Encounter for immunization: Secondary | ICD-10-CM | POA: Diagnosis not present

## 2018-03-25 DIAGNOSIS — E229 Hyperfunction of pituitary gland, unspecified: Secondary | ICD-10-CM | POA: Diagnosis not present

## 2018-03-25 MED ORDER — METFORMIN HCL 500 MG PO TABS: ORAL_TABLET | ORAL | 2 refills | Status: DC

## 2018-03-25 NOTE — Telephone Encounter (Signed)
Patient notified & advised of below.

## 2018-03-25 NOTE — Telephone Encounter (Signed)
Pt would like to know if Joycelyn Schmid can look at the ultrasound pics that was performed at [facility] today (03/25/18) so she can give her opinion on it. Pt cb [phone]

## 2018-03-25 NOTE — Progress Notes (Signed)
Gynecology Ultrasound Follow Up  Chief Complaint:  Chief Complaint  Patient presents with  . Follow-up    GYN Ultrasound     History of Present Illness: Patient is a 28 y.o. female who presents today for ultrasound evaluation of AUB-O .  Ultrasound demonstrates the following findgins Adnexa: normal size and consistence, no evidence of PCOS Uterus: Non-enlarged with endometrial stripe non-thickened without focal abnormaliteis Additional: no free fluid  Review of Systems: Review of Systems  Constitutional: Negative.   Gastrointestinal: Negative.   Genitourinary: Negative.   Neurological: Negative.     Past Medical History:  Past Medical History:  Diagnosis Date  . Chicken pox   . No known health problems     Past Surgical History:  Past Surgical History:  Procedure Laterality Date  . TONSILLECTOMY AND ADENOIDECTOMY  2009    Gynecologic History:  Patient's last menstrual period was 03/12/2018 (exact date). Contraception: none Last Pap: 02/28/2018 Results were: .no abnormalities  Family History:  Family History  Problem Relation Age of Onset  . Breast cancer Maternal Grandmother        40's-50's pt unsure  . Breast cancer Paternal Grandmother 31       had 2 seperate times    Social History:  Social History   Socioeconomic History  . Marital status: Married    Spouse name: Not on file  . Number of children: Not on file  . Years of education: Not on file  . Highest education level: Not on file  Occupational History  . Occupation: Therapist, art  Social Needs  . Financial resource strain: Not on file  . Food insecurity:    Worry: Not on file    Inability: Not on file  . Transportation needs:    Medical: Not on file    Non-medical: Not on file  Tobacco Use  . Smoking status: Never Smoker  . Smokeless tobacco: Never Used  Substance and Sexual Activity  . Alcohol use: Yes    Alcohol/week: 2.0 - 3.0 standard drinks    Types: 2 - 3 Standard  drinks or equivalent per week  . Drug use: No  . Sexual activity: Yes    Birth control/protection: Pill  Lifestyle  . Physical activity:    Days per week: Not on file    Minutes per session: Not on file  . Stress: Not on file  Relationships  . Social connections:    Talks on phone: Not on file    Gets together: Not on file    Attends religious service: Not on file    Active member of club or organization: Not on file    Attends meetings of clubs or organizations: Not on file    Relationship status: Not on file  . Intimate partner violence:    Fear of current or ex partner: Not on file    Emotionally abused: Not on file    Physically abused: Not on file    Forced sexual activity: Not on file  Other Topics Concern  . 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 Authorizing Provider  fexofenadine (ALLEGRA) 180 MG tablet fexofenadine 180 mg tablet  TAKE 1 TABLET (180 MG TOTAL) BY MOUTH DAILY.   Yes [provider]  norgestimate-ethinyl estradiol (ORTHO-CYCLEN,SPRINTEC,PREVIFEM) 0.25-35 MG-MCG tablet Take 1 tablet by mouth daily.  Yes [provider]  metFORMIN (GLUCOPHAGE) 500 MG tablet Take one tablet by mouth daily for one week. Then increase to one tablet twice a day for one week.  Then two tablets twice a day. 03/25/18   Malachy Mood, MD    Physical Exam Vitals: Blood pressure 130/76, pulse 100, weight 206 lb (93.4 kg), last menstrual period 03/12/2018. Body mass index is 35.36 kg/m.   General: NAD HEENT: normocephalic, anicteric Pulmonary: No increased work of breathing Neurologic: Grossly intact, normal gait Psychiatric: mood appropriate, affect full  US Transvaginal Non-ob  Result Date: 03/25/2018 Patient Name: Debra Wood DOB: 1990-10-05 MRN: [id number] ULTRASOUND REPORT Location:  [facility] Date of Service: 03/25/2018 Indications:AUB Findings: The uterus is anteverted and measures 8.8 x 4.9 x 3.1cm. Echo texture is homogenous without evidence of focal masses. The Endometrium measures 4.9 mm. Right Ovary measures 2.1 x 2.4 x 2.0cm. It is normal in appearance. Left Ovary measures 3.0 x 2.3 x 1.1cm. It is normal in appearance. Survey of the adnexa demonstrates no adnexal masses. Trace amount of fluid within the cervical canal. Impression: 1. Normal gyn ultrasound. Recommendations: 1.Clinical correlation with the patient's History and Physical Exam. Vita Barley, RDMS RVT Images reviewed.  Normal GYN study without visualized pathology.  Specifically, the ovaries do not have an appearance that would be suggestive of polycystic ovarian syndrome. Malachy Mood, MD, [facility], [facility] 03/25/2018, 1:06 PM     Assessment: 28 y.o. No obstetric history on file. No problem-specific Assessment & Plan notes found for this encounter.   Plan: Problem List Items Addressed This Visit    None    Visit Diagnoses    Need for Tdap vaccination    -  Primary   Relevant Orders   Tdap vaccine greater than or equal to 7yo IM (Completed)   Low TSH level       Relevant Orders   Thyroid Panel With TSH   Abnormal uterine bleeding (AUB)       Relevant Orders   Thyroid Panel With TSH   Prolactin   Elevated prolactin level (HCC)       Relevant Orders   Prolactin      1) AUB-O - anovulatory bleeding pattern and infertility.  Laboratory results reviewed and other than mild depressed TSH and mildly elevated prolactin not compatible with diagnosis of PCOS.  Ultrasound today also does not appear to support diagnosis of PCOS.  We discussed options for maximizing ovulatory cycles including weight loss vs starting letrozole or clomid.  At present patient is interested in pursuing weight loss.  She is aware while this may improve overall ovulatory function it may not  necessitate the need for ovulation induction agents.  It does optimize her pregnancy and decrease risk of pregnancy complications.  Patient is most interested in starting a trial of metformin and we discussed that this is generally considered off label but has some evidence in infertility associated with PCOS.  If patient does well with metformin consideration to starting a stimulant weight loss medication such as phentermine. - start metformin 500mg  po bid - Thyroid panel - Repeat prolactin measurement  2) A total of 20 minutes were spent in face-to-face contact with the patient during this encounter with over half of that time devoted to counseling and coordination of care.  Ultrasound images were independently reviewed.  3) Return in about 4 weeks (around 04/22/2018) for medication follow up.  Malachy Mood, MD, [facility], [facility] 03/25/2018, 7:00 PM

## 2018-03-25 NOTE — Telephone Encounter (Signed)
Call patient We can actually able to see her ultrasound report since in Epic.    I would defer to OB/GYN in regards to her interpretation.  It appears normal ultrasound; Dr Georgianne Fick didn't suspect PCOS.    Sorry I wish I be of more help however OB/GYN reviewed vaginal ultrasounds multiple times a day.

## 2018-03-26 LAB — PROLACTIN: PROLACTIN: 44.4 ng/mL — AB (ref 4.8–23.3)

## 2018-03-26 LAB — THYROID PANEL WITH TSH
FREE THYROXINE INDEX: 2 (ref 1.2–4.9)
T3 UPTAKE RATIO: 18 % — AB (ref 24–39)
T4, Total: 11.1 ug/dL (ref 4.5–12.0)
TSH: 0.363 u[IU]/mL — ABNORMAL LOW (ref 0.450–4.500)

## 2018-04-02 DIAGNOSIS — H16143 Punctate keratitis, bilateral: Secondary | ICD-10-CM | POA: Diagnosis not present

## 2018-04-02 DIAGNOSIS — H18823 Corneal disorder due to contact lens, bilateral: Secondary | ICD-10-CM | POA: Diagnosis not present

## 2018-04-02 DIAGNOSIS — H10413 Chronic giant papillary conjunctivitis, bilateral: Secondary | ICD-10-CM | POA: Diagnosis not present

## 2018-04-03 ENCOUNTER — Other Ambulatory Visit: Payer: Self-pay | Admitting: Obstetrics and Gynecology

## 2018-04-03 DIAGNOSIS — E221 Hyperprolactinemia: Secondary | ICD-10-CM

## 2018-04-03 DIAGNOSIS — R51 Headache: Secondary | ICD-10-CM

## 2018-04-03 DIAGNOSIS — R519 Headache, unspecified: Secondary | ICD-10-CM

## 2018-04-03 NOTE — Addendum Note (Signed)
Addended by: Dorthula Nettles on: 04/03/2018 02:22 PM   Modules accepted: Orders

## 2018-04-09 DIAGNOSIS — H10413 Chronic giant papillary conjunctivitis, bilateral: Secondary | ICD-10-CM | POA: Diagnosis not present

## 2018-04-09 DIAGNOSIS — H18823 Corneal disorder due to contact lens, bilateral: Secondary | ICD-10-CM | POA: Diagnosis not present

## 2018-04-09 DIAGNOSIS — H16143 Punctate keratitis, bilateral: Secondary | ICD-10-CM | POA: Diagnosis not present

## 2018-04-10 ENCOUNTER — Ambulatory Visit
Admission: RE | Admit: 2018-04-10 | Discharge: 2018-04-10 | Disposition: A | Discharge status: Home / Self Care | Payer: BLUE CROSS/BLUE SHIELD | Source: Ambulatory Visit | Attending: Obstetrics and Gynecology | Admitting: Obstetrics and Gynecology

## 2018-04-10 DIAGNOSIS — H539 Unspecified visual disturbance: Secondary | ICD-10-CM | POA: Diagnosis not present

## 2018-04-10 DIAGNOSIS — R51 Headache: Secondary | ICD-10-CM | POA: Diagnosis not present

## 2018-04-10 DIAGNOSIS — R519 Headache, unspecified: Secondary | ICD-10-CM

## 2018-04-10 MED ORDER — GADOBUTROL 1 MMOL/ML IV SOLN
9.0000 mL | Freq: Once | INTRAVENOUS | Status: AC | PRN
  Administered 2018-04-10: 9 mL via INTRAVENOUS

## 2018-04-11 ENCOUNTER — Other Ambulatory Visit: Payer: Self-pay | Admitting: Obstetrics and Gynecology

## 2018-04-11 DIAGNOSIS — E221 Hyperprolactinemia: Secondary | ICD-10-CM

## 2018-04-11 DIAGNOSIS — E236 Other disorders of pituitary gland: Secondary | ICD-10-CM

## 2018-04-11 DIAGNOSIS — N970 Female infertility associated with anovulation: Secondary | ICD-10-CM

## 2018-04-19 ENCOUNTER — Encounter: Payer: Self-pay | Admitting: Family

## 2018-04-22 ENCOUNTER — Ambulatory Visit: Payer: BLUE CROSS/BLUE SHIELD | Admitting: Obstetrics and Gynecology

## 2018-04-23 ENCOUNTER — Encounter: Payer: Self-pay | Admitting: Family Medicine

## 2018-04-23 ENCOUNTER — Ambulatory Visit: Payer: BLUE CROSS/BLUE SHIELD | Admitting: Family Medicine

## 2018-04-23 DIAGNOSIS — L237 Allergic contact dermatitis due to plants, except food: Secondary | ICD-10-CM | POA: Diagnosis not present

## 2018-04-23 DIAGNOSIS — R51 Headache: Secondary | ICD-10-CM | POA: Diagnosis not present

## 2018-04-23 DIAGNOSIS — E236 Other disorders of pituitary gland: Secondary | ICD-10-CM | POA: Diagnosis not present

## 2018-04-23 MED ORDER — PREDNISONE 20 MG PO TABS: ORAL_TABLET | ORAL | 0 refills | Status: DC

## 2018-04-23 NOTE — Progress Notes (Signed)
  Tommi Rumps, MD Phone: [phone]  Debra Wood is a 28 y.o. female who presents today for same-day visit.  CC: Rash  Rash: Patient notes onset yesterday.  She believes it is related to poison ivy.  Her nephews got into this outside and she touched some of their clothing.  Started on her right hand and then developed on her left neck and now onto her right cheek.  It is very itchy.  She is had no fevers.  No changes in medications, soaps, or detergent.  She has tried calamine, a scrub from the pharmacy, and Benadryl with little benefit.  She has a history of this in the past and typically would require a prednisone taper.  Social History   Tobacco Use  Smoking Status Never Smoker  Smokeless Tobacco Never Used     ROS see history of present illness  Objective  Physical Exam Vitals:   04/23/18 1514  BP: 114/80  Pulse: (!) 108  Temp: 97.8 F (36.6 C)  SpO2: 96%    BP Readings from Last 3 Encounters:  04/23/18 114/80  03/25/18 130/76  02/28/18 131/76   Wt Readings from Last 3 Encounters:  04/23/18 205 lb 12 oz (93.3 kg)  03/25/18 206 lb (93.4 kg)  02/28/18 208 lb (94.3 kg)    Physical Exam Constitutional:      General: She is not in acute distress.    Appearance: She is not diaphoretic.  Cardiovascular:     Rate and Rhythm: Normal rate and regular rhythm.     Heart sounds: Normal heart sounds.  Pulmonary:     Effort: Pulmonary effort is normal.  Skin:    General: Skin is warm and dry.  Neurological:     Mental Status: She is alert.      Papulovesicular rash on right hand and left neck underneath jawline, similar smaller rash on right cheek    Assessment/Plan: Please see individual problem list.  Poison ivy dermatitis Rash most consistent with poison ivy dermatitis.  Given that it is on her face we will treat with a steroid taper.  Discussed possible agitation, sleep difficulty, and increased appetite with steroid.  She was encouraged to wash  all clothes and sheets that have been used.  If not improving she will let us know.  If worsening she will let us know.  Given return precautions.   No orders of the defined types were placed in this encounter.   Meds ordered this encounter  Medications  . predniSONE (DELTASONE) 20 MG tablet    Sig: Take 40 mg (2 tablets) by mouth daily for 7 days, then take 20 mg (1 tablet) by mouth daily for 4 days, then take 10 mg (half a tablet) by mouth daily for 4 days    Dispense:  20 tablet    Refill:  0     Tommi Rumps, MD [facility]

## 2018-04-23 NOTE — Patient Instructions (Signed)
Nice to see you. We will treated with a prednisone taper for your poison ivy. If this worsens or you develop issues with getting into your eyes or into your mouth or throat please be evaluated again. You can try over-the-counter Zyrtec or Claritin to help with itching.

## 2018-04-23 NOTE — Assessment & Plan Note (Addendum)
Rash most consistent with poison ivy dermatitis.  Given that it is on her face we will treat with a steroid taper.  Discussed possible agitation, sleep difficulty, and increased appetite with steroid.  She was encouraged to wash all clothes and sheets that have been used.  If not improving she will let us know.  If worsening she will let us know.  Given return precautions.

## 2018-04-24 ENCOUNTER — Ambulatory Visit (INDEPENDENT_AMBULATORY_CARE_PROVIDER_SITE_OTHER): Payer: BLUE CROSS/BLUE SHIELD | Admitting: Obstetrics and Gynecology

## 2018-04-24 ENCOUNTER — Encounter: Payer: Self-pay | Admitting: Obstetrics and Gynecology

## 2018-04-24 VITALS — BP 138/80 | Ht 64.0 in | Wt 207.0 lb

## 2018-04-24 DIAGNOSIS — Z6835 Body mass index (BMI) 35.0-35.9, adult: Secondary | ICD-10-CM

## 2018-04-24 DIAGNOSIS — E669 Obesity, unspecified: Secondary | ICD-10-CM | POA: Diagnosis not present

## 2018-04-24 MED ORDER — PHENTERMINE HCL 37.5 MG PO TABS: 37.5000 mg | ORAL_TABLET | Freq: Every day | ORAL | 0 refills | Status: DC

## 2018-04-24 NOTE — Progress Notes (Signed)
Gynecology Office Visit  Chief Complaint:  Chief Complaint  Patient presents with  . Follow-up    History of Present Illness: Patientis a 28 y.o. No obstetric history on file. female, who presents for the evaluation of the desire to lose weight. She has gained 1 lbs since last visit The patient states the following symptoms since starting her significant GI upset after the first week which caused her to self discontinue metformin.  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     Review of Systems: 10 point review of systems negative unless otherwise noted in HPI  Past Medical History:  Past Medical History:  Diagnosis Date  . Chicken pox   . No known health problems     Past Surgical History:  Past Surgical History:  Procedure Laterality Date  . TONSILLECTOMY AND ADENOIDECTOMY  2009    Gynecologic History: Patient's last menstrual period was 04/07/2018.  Obstetric History: No obstetric history on file.  Family History:  Family History  Problem Relation Age of Onset  . Breast cancer Maternal Grandmother        40's-50's pt unsure  . Breast cancer Paternal Grandmother 78       had 2 seperate times    Social History:  Social History   Socioeconomic History  . Marital status: Married    Spouse name: Not on file  . Number of children: Not on file  . Years of education: Not on file  . Highest education level: Not on file  Occupational History  . Occupation: Therapist, art  Social Needs  . Financial resource strain: Not on file  . Food insecurity:    Worry: Not on file    Inability: Not on file  . Transportation needs:    Medical: Not on file    Non-medical: Not on file  Tobacco Use  . Smoking status: Never Smoker  . Smokeless tobacco: Never Used  Substance and Sexual Activity  . Alcohol use: Yes    Alcohol/week: 2.0 - 3.0 standard drinks    Types: 2 - 3  Standard drinks or equivalent per week  . Drug use: No  . Sexual activity: Yes    Birth control/protection: Pill  Lifestyle  . Physical activity:    Days per week: Not on file    Minutes per session: Not on file  . Stress: Not on file  Relationships  . Social connections:    Talks on phone: Not on file    Gets together: Not on file    Attends religious service: Not on file    Active member of club or organization: Not on file    Attends meetings of clubs or organizations: Not on file    Relationship status: Not on file  . Intimate partner violence:    Fear of current or ex partner: Not on file    Emotionally abused: Not on file    Physically abused: Not on file    Forced sexual activity: Not on file  Other Topics Concern  . 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 Authorizing Provider  fexofenadine (ALLEGRA) 180 MG tablet fexofenadine 180 mg tablet  TAKE 1 TABLET (180 MG TOTAL) BY MOUTH DAILY.   Yes [provider]  metFORMIN (GLUCOPHAGE) 500 MG tablet Take one tablet by mouth daily for one week. Then increase to one tablet twice a day for one week.  Then two tablets twice a day. 03/25/18  Yes Malachy Mood, MD  norgestimate-ethinyl estradiol (ORTHO-CYCLEN,SPRINTEC,PREVIFEM) 0.25-35 MG-MCG tablet Take 1 tablet by mouth daily.   Yes [provider]  predniSONE (DELTASONE) 20 MG tablet Take 40 mg (2 tablets) by mouth daily for 7 days, then take 20 mg (1 tablet) by mouth daily for 4 days, then take 10 mg (half a tablet) by mouth daily for 4 days Patient not taking: Reported on 04/24/2018 04/23/18   Leone Haven, MD    Physical Exam Blood pressure 138/80, height 5\' 4"  (1.626 m), weight 207 lb (93.9 kg), last menstrual period 04/07/2018. Wt Readings from Last 3 Encounters:  04/24/18 207 lb (93.9 kg)   04/23/18 205 lb 12 oz (93.3 kg)  03/25/18 206 lb (93.4 kg)  Body mass index is 35.53 kg/m.   General: NAD HEENT: normocephalic, anicteric Thyroid: no enlargement Pulmonary: no increased work of breathing Neurologic: Grossly intact Psychiatric: mood appropriate, affect full  Assessment: 28 y.o. weight loss management Plan: Problem List Items Addressed This Visit    None    Visit Diagnoses    Class 2 obesity without serious comorbidity with body mass index (BMI) of 35.0 to 35.9 in adult, unspecified obesity type    -  Primary   Relevant Medications   phentermine (ADIPEX-P) 37.5 MG tablet      1) 1500 Calorie ADA Diet  2) Patient education given regarding appropriate lifestyle changes for weight loss including: regular physical activity, healthy coping strategies, caloric restriction and healthy eating patterns.  3) Patient will be started on weight loss medication. The risks and benefits and side effects of medication, such as Adipex (Phenteramine) ,  Tenuate (Diethylproprion), Belviq (lorcarsin), Contrave (buproprion/naltrexone), Qsymia (phentermine/topiramate), and Saxenda (liraglutide) is discussed. The pros and cons of suppressing appetite and boosting metabolism is discussed. Risks of tolerence and addiction is discussed for selected agents discussed. Use of medicine will ne short term, such as 3-4 months at a time followed by a period of time off of the medicine to avoid these risks and side effects for Adipex, Qsymia, and Tenuate discussed. Pt to call with any negative side effects and agrees to keep follow up appts.  4) Patient to take medication, with the benefits of appetite suppression and metabolism boost d/w pt, along with the side effects and risk factors of long term use that will be avoided with our use of short bursts of therapy. Rx provided.   - discontinue metformin did not tolerate because of GI upset - trial of phentermine  5) 15 minutes face-to-face; with  counseling/coordination of care > 50 percent of visit related to obesity and ongoing management/treatment   6) Neurology - currently being worked up for mild hyperprolactinemia and Rathke's cyst  7) Return in about 4 weeks (around 05/22/2018) for medication follow up.    Malachy Mood, MD, [facility], [facility] 04/24/2018, 4:44 PM

## 2018-05-06 DIAGNOSIS — E236 Other disorders of pituitary gland: Secondary | ICD-10-CM | POA: Diagnosis not present

## 2018-05-06 DIAGNOSIS — R7989 Other specified abnormal findings of blood chemistry: Secondary | ICD-10-CM | POA: Diagnosis not present

## 2018-05-15 DIAGNOSIS — E236 Other disorders of pituitary gland: Secondary | ICD-10-CM | POA: Diagnosis not present

## 2018-05-23 ENCOUNTER — Ambulatory Visit: Payer: BLUE CROSS/BLUE SHIELD | Admitting: Obstetrics and Gynecology

## 2018-05-23 DIAGNOSIS — R7989 Other specified abnormal findings of blood chemistry: Secondary | ICD-10-CM | POA: Diagnosis not present

## 2018-05-28 ENCOUNTER — Encounter: Payer: Self-pay | Admitting: Family Medicine

## 2018-05-28 ENCOUNTER — Other Ambulatory Visit: Payer: Self-pay

## 2018-05-28 ENCOUNTER — Ambulatory Visit: Payer: BLUE CROSS/BLUE SHIELD | Admitting: Family Medicine

## 2018-05-28 VITALS — BP 118/60 | HR 87 | Temp 98.3°F | Resp 16 | Ht 64.0 in | Wt 207.0 lb

## 2018-05-28 DIAGNOSIS — J309 Allergic rhinitis, unspecified: Secondary | ICD-10-CM | POA: Diagnosis not present

## 2018-05-28 MED ORDER — FLUTICASONE PROPIONATE 50 MCG/ACT NA SUSP: 2.0000 | Freq: Every day | NASAL | 6 refills | Status: DC

## 2018-05-28 NOTE — Progress Notes (Signed)
Subjective:    Patient ID: Debra Wood, female    DOB: [birth year], 28 y.o.   MRN: [id number]  HPI   Patient presents to clinic due to nasal congestion, sinus pain and pressure, clear sometimes yellow drainage for 1 to 2 days.  Patient reports a long history of seasonal allergies usually takes Claritin every day.  Has not taken any decongestants or use any nasal spray recently.  No fever or chills.  No nausea, vomiting or diarrhea.  No body aches.  No cough.  Patient has had no recent travel or contact with anyone suspected of the coronavirus.   Patient Active Problem List   Diagnosis Date Noted  . Poison ivy dermatitis 04/23/2018  . Acute pain of left knee 02/14/2018  . Bilateral foot pain 02/14/2018  . Seasonal allergies 01/08/2017  . Dysuria 01/08/2017  . Dermatitis due to plants, including poison ivy, sumac, and oak 10/16/2016  . Obesity (BMI 30.0-34.9) 04/16/2015  . Migraine 03/04/2015  . Preventative health care 03/04/2015  . GERD (gastroesophageal reflux disease) 03/04/2015   Social History   Tobacco Use  . Smoking status: Never Smoker  . Smokeless tobacco: Never Used  Substance Use Topics  . Alcohol use: Yes    Alcohol/week: 2.0 - 3.0 standard drinks    Types: 2 - 3 Standard drinks or equivalent per week   Review of Systems  Constitutional: Negative for chills, fatigue and fever.  HENT: +clear/white nasal drainage, nasal congestion, sneezing for 1-2 days   Eyes: Negative.   Respiratory: Negative for cough, shortness of breath and wheezing.   Cardiovascular: Negative for chest pain, palpitations and leg swelling.  Gastrointestinal: Negative for abdominal pain, diarrhea, nausea and vomiting.  Genitourinary: Negative for dysuria, frequency and urgency.  Musculoskeletal: Negative for arthralgias and myalgias.  Skin: Negative for color change, pallor and rash.  Neurological: Negative for syncope, light-headedness and headaches.  Psychiatric/Behavioral: The  patient is not nervous/anxious.    Objective:   Physical Exam Vitals signs and nursing note reviewed.  Constitutional:      General: She is not in acute distress.    Appearance: She is not toxic-appearing.  HENT:     Head: Normocephalic and atraumatic.     Ears:     Comments: +fullness bilat TMs    Nose: Congestion and rhinorrhea (clear drainage) present.     Mouth/Throat:     Mouth: Mucous membranes are moist.     Pharynx: No oropharyngeal exudate or posterior oropharyngeal erythema.  Eyes:     General: No scleral icterus.       Right eye: No discharge.        Left eye: No discharge.     Extraocular Movements: Extraocular movements intact.     Conjunctiva/sclera: Conjunctivae normal.     Pupils: Pupils are equal, round, and reactive to light.  Neck:     Musculoskeletal: Neck supple. No neck rigidity.  Cardiovascular:     Rate and Rhythm: Normal rate and regular rhythm.  Pulmonary:     Effort: Pulmonary effort is normal. No respiratory distress.     Breath sounds: Normal breath sounds. No wheezing, rhonchi or rales.  Lymphadenopathy:     Cervical: No cervical adenopathy.  Skin:    General: Skin is warm and dry.     Coloration: Skin is not jaundiced or pale.  Neurological:     Mental Status: She is alert and oriented to person, place, and time.  Psychiatric:  Mood and Affect: Mood normal.        Behavior: Behavior normal.    Vitals:   05/28/18 0854  BP: 118/60  Pulse: 87  Resp: 16  Temp: 98.3 F (36.8 C)  SpO2: 97%      Assessment & Plan:   Allergic rhinitis-patient's symptoms and exam are consistent with an allergic rhinitis flareup.  Patient will continue Allegra daily.  Advised she can try using Mucinex D to help decongest and calm any sort of dry cough related to postnasal drainage.  Also advised to use Flonase nasal spray to help open up the sinus passages and assist with draining.  Advised if symptoms worsen in any way, mucus becomes thick yellow and  purulent, develops fever or chills, develops worsening cough or shortness of breath, develops high fever to call office right away for further instruction.  Otherwise patient will keep regularly scheduled follow-up with PCP as planned and return to clinic sooner if any issues arise.

## 2018-05-28 NOTE — Patient Instructions (Addendum)
Continue allegra  Take mucinex D (mucinex plus decongestant, have to ask for behind pharmacy counter) to help get things draining  Use flonase   Allergic Rhinitis, Adult Allergic rhinitis is a reaction to allergens in the air. Allergens are tiny specks (particles) in the air that cause your body to have an allergic reaction. This condition cannot be passed from person to person (is not contagious). Allergic rhinitis cannot be cured, but it can be controlled. There are two types of allergic rhinitis:  Seasonal. This type is also called hay fever. It happens only during certain times of the year.  Perennial. This type can happen at any time of the year. What are the causes? This condition may be caused by:  Pollen from grasses, trees, and weeds.  House dust mites.  Pet dander.  Mold. What are the signs or symptoms? Symptoms of this condition include:  Sneezing.  Runny or stuffy nose (nasal congestion).  A lot of mucus in the back of the throat (postnasal drip).  Itchy nose.  Tearing of the eyes.  Trouble sleeping.  Being sleepy during day. How is this treated? There is no cure for this condition. You should avoid things that trigger your symptoms (allergens). Treatment can help to relieve symptoms. This may include:  Medicines that block allergy symptoms, such as antihistamines. These may be given as a shot, nasal spray, or pill.  Shots that are given until your body becomes less sensitive to the allergen (desensitization).  Stronger medicines, if all other treatments have not worked. Follow these instructions at home: Avoiding allergens   Find out what you are allergic to. Common allergens include smoke, dust, and pollen.  Avoid them if you can. These are some of the things that you can do to avoid allergens: ? Replace carpet with wood, tile, or vinyl flooring. Carpet can trap dander and dust. ? Clean any mold found in the home. ? Do not smoke. Do not allow  smoking in your home. ? Change your heating and air conditioning filter at least once a month. ? During allergy season:  Keep windows closed as much as you can. If possible, use air conditioning when there is a lot of pollen in the air.  Use a special filter for allergies with your furnace and air conditioner.  Plan outdoor activities when pollen counts are lowest. This is usually during the early morning or evening hours.  If you do go outdoors when pollen count is high, wear a special mask for people with allergies.  When you come indoors, take a shower and change your clothes before sitting on furniture or bedding. General instructions  Do not use fans in your home.  Do not hang clothes outside to dry.  Wear sunglasses to keep pollen out of your eyes.  Wash your hands right away after you touch household pets.  Take over-the-counter and prescription medicines only as told by your doctor.  Keep all follow-up visits as told by your doctor. This is important. Contact a doctor if:  You have a fever.  You have a cough that does not go away (is persistent).  You start to make whistling sounds when you breathe (wheeze).  Your symptoms do not get better with treatment.  You have thick fluid coming from your nose.  You start to have nosebleeds. Get help right away if:  Your tongue or your lips are swollen.  You have trouble breathing.  You feel dizzy or you feel like you are going  to pass out (faint).  You have cold sweats. Summary  Allergic rhinitis is a reaction to allergens in the air.  This condition may be caused by allergens. These include pollen, dust mites, pet dander, and mold.  Symptoms include a runny, itchy nose, sneezing, or tearing eyes. You may also have trouble sleeping or feel sleepy during the day.  Treatment includes taking medicines and avoiding allergens. You may also get shots or take stronger medicines.  Get help if you have a fever or a  cough that does not stop. Get help right away if you are short of breath. This information is not intended to replace advice given to you by your health care provider. Make sure you discuss any questions you have with your health care provider. Document Released: 06/29/2010 Document Revised: 09/18/2017 Document Reviewed: 09/18/2017 Elsevier Interactive Patient Education  2019 [employer].

## 2018-08-12 HISTORY — PX: PITUITARY SURGERY: SHX203

## 2018-08-26 DIAGNOSIS — K219 Gastro-esophageal reflux disease without esophagitis: Secondary | ICD-10-CM | POA: Insufficient documentation

## 2018-08-26 DIAGNOSIS — Z01818 Encounter for other preprocedural examination: Secondary | ICD-10-CM | POA: Diagnosis not present

## 2018-08-26 DIAGNOSIS — E236 Other disorders of pituitary gland: Secondary | ICD-10-CM | POA: Diagnosis not present

## 2018-08-26 DIAGNOSIS — J309 Allergic rhinitis, unspecified: Secondary | ICD-10-CM | POA: Diagnosis not present

## 2018-08-26 DIAGNOSIS — D497 Neoplasm of unspecified behavior of endocrine glands and other parts of nervous system: Secondary | ICD-10-CM | POA: Diagnosis not present

## 2018-08-26 DIAGNOSIS — D352 Benign neoplasm of pituitary gland: Secondary | ICD-10-CM | POA: Diagnosis not present

## 2018-08-26 DIAGNOSIS — Z6833 Body mass index (BMI) 33.0-33.9, adult: Secondary | ICD-10-CM | POA: Insufficient documentation

## 2018-08-26 DIAGNOSIS — J342 Deviated nasal septum: Secondary | ICD-10-CM | POA: Diagnosis not present

## 2018-08-26 DIAGNOSIS — R0981 Nasal congestion: Secondary | ICD-10-CM | POA: Diagnosis not present

## 2018-08-26 DIAGNOSIS — J343 Hypertrophy of nasal turbinates: Secondary | ICD-10-CM | POA: Diagnosis not present

## 2018-08-26 DIAGNOSIS — J3489 Other specified disorders of nose and nasal sinuses: Secondary | ICD-10-CM | POA: Diagnosis not present

## 2018-08-26 DIAGNOSIS — J3089 Other allergic rhinitis: Secondary | ICD-10-CM | POA: Diagnosis not present

## 2018-09-03 DIAGNOSIS — D352 Benign neoplasm of pituitary gland: Secondary | ICD-10-CM | POA: Diagnosis not present

## 2018-09-03 DIAGNOSIS — E669 Obesity, unspecified: Secondary | ICD-10-CM | POA: Diagnosis not present

## 2018-09-03 DIAGNOSIS — Z6834 Body mass index (BMI) 34.0-34.9, adult: Secondary | ICD-10-CM | POA: Diagnosis not present

## 2018-09-03 DIAGNOSIS — G960 Cerebrospinal fluid leak: Secondary | ICD-10-CM | POA: Diagnosis not present

## 2018-09-03 DIAGNOSIS — J302 Other seasonal allergic rhinitis: Secondary | ICD-10-CM | POA: Diagnosis not present

## 2018-09-03 DIAGNOSIS — E236 Other disorders of pituitary gland: Secondary | ICD-10-CM | POA: Diagnosis not present

## 2018-09-03 DIAGNOSIS — J343 Hypertrophy of nasal turbinates: Secondary | ICD-10-CM | POA: Diagnosis not present

## 2018-09-03 DIAGNOSIS — D497 Neoplasm of unspecified behavior of endocrine glands and other parts of nervous system: Secondary | ICD-10-CM | POA: Diagnosis not present

## 2018-09-03 DIAGNOSIS — D443 Neoplasm of uncertain behavior of pituitary gland: Secondary | ICD-10-CM | POA: Diagnosis not present

## 2018-09-03 DIAGNOSIS — Z793 Long term (current) use of hormonal contraceptives: Secondary | ICD-10-CM | POA: Diagnosis not present

## 2018-09-03 DIAGNOSIS — E282 Polycystic ovarian syndrome: Secondary | ICD-10-CM | POA: Diagnosis not present

## 2018-09-03 DIAGNOSIS — K219 Gastro-esophageal reflux disease without esophagitis: Secondary | ICD-10-CM | POA: Diagnosis not present

## 2018-09-03 DIAGNOSIS — Z1159 Encounter for screening for other viral diseases: Secondary | ICD-10-CM | POA: Diagnosis not present

## 2018-09-03 DIAGNOSIS — J342 Deviated nasal septum: Secondary | ICD-10-CM | POA: Diagnosis not present

## 2018-09-03 DIAGNOSIS — Z79899 Other long term (current) drug therapy: Secondary | ICD-10-CM | POA: Diagnosis not present

## 2018-09-05 DIAGNOSIS — D352 Benign neoplasm of pituitary gland: Secondary | ICD-10-CM | POA: Diagnosis not present

## 2018-09-05 DIAGNOSIS — D497 Neoplasm of unspecified behavior of endocrine glands and other parts of nervous system: Secondary | ICD-10-CM | POA: Diagnosis not present

## 2018-09-09 DIAGNOSIS — Z4889 Encounter for other specified surgical aftercare: Secondary | ICD-10-CM | POA: Diagnosis not present

## 2018-09-09 DIAGNOSIS — D497 Neoplasm of unspecified behavior of endocrine glands and other parts of nervous system: Secondary | ICD-10-CM | POA: Diagnosis not present

## 2018-09-09 DIAGNOSIS — D352 Benign neoplasm of pituitary gland: Secondary | ICD-10-CM | POA: Diagnosis not present

## 2018-09-12 DIAGNOSIS — D352 Benign neoplasm of pituitary gland: Secondary | ICD-10-CM | POA: Diagnosis not present

## 2018-09-12 DIAGNOSIS — J3489 Other specified disorders of nose and nasal sinuses: Secondary | ICD-10-CM | POA: Diagnosis not present

## 2018-09-15 ENCOUNTER — Other Ambulatory Visit: Payer: Self-pay | Admitting: Family

## 2018-09-30 DIAGNOSIS — Z4889 Encounter for other specified surgical aftercare: Secondary | ICD-10-CM | POA: Diagnosis not present

## 2018-09-30 DIAGNOSIS — J3489 Other specified disorders of nose and nasal sinuses: Secondary | ICD-10-CM | POA: Diagnosis not present

## 2018-09-30 DIAGNOSIS — D497 Neoplasm of unspecified behavior of endocrine glands and other parts of nervous system: Secondary | ICD-10-CM | POA: Diagnosis not present

## 2018-10-16 DIAGNOSIS — D352 Benign neoplasm of pituitary gland: Secondary | ICD-10-CM | POA: Diagnosis not present

## 2018-10-16 DIAGNOSIS — E236 Other disorders of pituitary gland: Secondary | ICD-10-CM | POA: Diagnosis not present

## 2018-10-16 DIAGNOSIS — Z9889 Other specified postprocedural states: Secondary | ICD-10-CM | POA: Diagnosis not present

## 2018-10-16 DIAGNOSIS — Z48811 Encounter for surgical aftercare following surgery on the nervous system: Secondary | ICD-10-CM | POA: Diagnosis not present

## 2018-10-28 DIAGNOSIS — J3489 Other specified disorders of nose and nasal sinuses: Secondary | ICD-10-CM | POA: Diagnosis not present

## 2018-11-23 ENCOUNTER — Other Ambulatory Visit: Payer: Self-pay | Admitting: Family

## 2018-11-27 MED ORDER — FEXOFENADINE HCL 180 MG PO TABS: 180.0000 mg | ORAL_TABLET | Freq: Every day | ORAL | 0 refills | Status: DC

## 2018-12-17 DIAGNOSIS — E236 Other disorders of pituitary gland: Secondary | ICD-10-CM | POA: Diagnosis not present

## 2018-12-19 ENCOUNTER — Other Ambulatory Visit: Payer: Self-pay | Admitting: Family

## 2018-12-20 ENCOUNTER — Encounter: Payer: Self-pay | Admitting: Physician Assistant

## 2018-12-20 ENCOUNTER — Telehealth: Payer: BLUE CROSS/BLUE SHIELD | Admitting: Physician Assistant

## 2018-12-20 ENCOUNTER — Encounter (INDEPENDENT_AMBULATORY_CARE_PROVIDER_SITE_OTHER): Payer: Self-pay

## 2018-12-20 DIAGNOSIS — R059 Cough, unspecified: Secondary | ICD-10-CM

## 2018-12-20 DIAGNOSIS — Z20822 Contact with and (suspected) exposure to covid-19: Secondary | ICD-10-CM

## 2018-12-20 DIAGNOSIS — R05 Cough: Secondary | ICD-10-CM

## 2018-12-20 DIAGNOSIS — R197 Diarrhea, unspecified: Secondary | ICD-10-CM

## 2018-12-20 DIAGNOSIS — Z20828 Contact with and (suspected) exposure to other viral communicable diseases: Secondary | ICD-10-CM

## 2018-12-20 MED ORDER — BENZONATATE 100 MG PO CAPS: 100.0000 mg | ORAL_CAPSULE | Freq: Two times a day (BID) | ORAL | 0 refills | Status: DC | PRN

## 2018-12-20 MED ORDER — FLUTICASONE PROPIONATE 50 MCG/ACT NA SUSP: 1.0000 | Freq: Every day | NASAL | 0 refills | Status: DC

## 2018-12-20 NOTE — Progress Notes (Signed)
E-Visit for Corona Virus Screening   Your current symptoms could be consistent with the coronavirus.  Many health care providers can now test patients at their office but not all are.  [facility] has multiple testing sites. For information on our COVID testing locations and hours go to [email or web address]  Please quarantine yourself while awaiting your test results.  We are enrolling you in our [place] for [place] . Daily you will receive a questionnaire within the [place] website. Our COVID 19 response team willl be monitoriing your responses daily.      COVID-19 is a respiratory illness with symptoms that are similar to the flu. Symptoms are typically mild to moderate, but there have been cases of severe illness and death due to the virus. The following symptoms may appear 2-14 days after exposure: . Fever . Cough . Shortness of breath or difficulty breathing . Chills . Repeated shaking with chills . Muscle pain . Headache . Sore throat . New loss of taste or smell . Fatigue . Congestion or runny nose . Nausea or vomiting . Diarrhea  It is vitally important that if you feel that you have an infection such as this virus or any other virus that you stay home and away from places where you may spread it to others.  You should self-quarantine for 14 days if you have symptoms that could potentially be coronavirus or have been in close contact a with a person diagnosed with COVID-19 within the last 2 weeks. You should avoid contact with people age 33 and older.   You should wear a mask or cloth face covering over your nose and mouth if you must be around other people or animals, including pets (even at home). Try to stay at least 6 feet away from other people. This will protect the people around you.  You can use medication such as A prescription cough medication called Tessalon Perles 100 mg. You may take 1-2 capsules every 8 hours as needed for  cough  You may also take acetaminophen (Tylenol) as needed for fever.  I have provided a work note  I have also sent in Flonase, 1 sprays in each nostril once daily  for nasal congestion.      Reduce your risk of any infection by using the same precautions used for avoiding the common cold or flu:  Marland Kitchen Wash your hands often with soap and warm water for at least 20 seconds.  If soap and water are not readily available, use an alcohol-based hand sanitizer with at least 60% alcohol.  . If coughing or sneezing, cover your mouth and nose by coughing or sneezing into the elbow areas of your shirt or coat, into a tissue or into your sleeve (not your hands). . Avoid shaking hands with others and consider head nods or verbal greetings only. . Avoid touching your eyes, nose, or mouth with unwashed hands.  . Avoid close contact with people who are sick. . Avoid places or events with large numbers of people in one location, like concerts or sporting events. . Carefully consider travel plans you have or are making. . If you are planning any travel outside or inside the [place], visit the CDC's Travelers' Health webpage for the latest health notices. . If you have some symptoms but not all symptoms, continue to monitor at home and seek medical attention if your symptoms worsen. . If you are having a medical emergency, call 911.  HOME CARE . Only take medications  as instructed by your medical team. . Drink plenty of fluids and get plenty of rest. . A steam or ultrasonic humidifier can help if you have congestion.   GET HELP RIGHT AWAY IF YOU HAVE EMERGENCY WARNING SIGNS** FOR COVID-19. If you or someone is showing any of these signs seek emergency medical care immediately. Call 911 or proceed to your closest emergency facility if: . You develop worsening high fever. . Trouble breathing . Bluish lips or face . Persistent pain or pressure in the chest . New confusion . Inability to wake or stay  awake . You cough up blood. . Your symptoms become more severe  **This list is not all possible symptoms. Contact your medical provider for any symptoms that are sever or concerning to you.   MAKE SURE YOU   Understand these instructions.  Will watch your condition.  Will get help right away if you are not doing well or get worse.  Your e-visit answers were reviewed by a board certified advanced clinical practitioner to complete your personal care plan.  Depending on the condition, your plan could have included both over the counter or prescription medications.  If there is a problem please reply once you have received a response from your provider.  Your safety is important to [place].  If you have drug allergies check your prescription carefully.    You can use MyChart to ask questions about today's visit, request a non-urgent call back, or ask for a work or school excuse for 24 hours related to this e-Visit. If it has been greater than 24 hours you will need to follow up with your provider, or enter a new e-Visit to address those concerns. You will get an e-mail in the next two days asking about your experience.  I hope that your e-visit has been valuable and will speed your recovery. Thank you for using e-visits.   I spent 5-10 minutes on review and completion of this note- Lacy Duverney [facility]

## 2018-12-21 DIAGNOSIS — Z20828 Contact with and (suspected) exposure to other viral communicable diseases: Secondary | ICD-10-CM | POA: Diagnosis not present

## 2018-12-21 DIAGNOSIS — U071 COVID-19: Secondary | ICD-10-CM | POA: Diagnosis not present

## 2018-12-22 ENCOUNTER — Encounter (INDEPENDENT_AMBULATORY_CARE_PROVIDER_SITE_OTHER): Payer: Self-pay

## 2018-12-23 ENCOUNTER — Encounter (INDEPENDENT_AMBULATORY_CARE_PROVIDER_SITE_OTHER): Payer: Self-pay

## 2018-12-24 ENCOUNTER — Encounter (INDEPENDENT_AMBULATORY_CARE_PROVIDER_SITE_OTHER): Payer: Self-pay

## 2018-12-25 ENCOUNTER — Encounter (INDEPENDENT_AMBULATORY_CARE_PROVIDER_SITE_OTHER): Payer: Self-pay

## 2018-12-26 ENCOUNTER — Encounter (INDEPENDENT_AMBULATORY_CARE_PROVIDER_SITE_OTHER): Payer: Self-pay

## 2018-12-27 ENCOUNTER — Encounter (INDEPENDENT_AMBULATORY_CARE_PROVIDER_SITE_OTHER): Payer: Self-pay

## 2018-12-28 ENCOUNTER — Encounter (INDEPENDENT_AMBULATORY_CARE_PROVIDER_SITE_OTHER): Payer: Self-pay

## 2018-12-29 ENCOUNTER — Encounter (INDEPENDENT_AMBULATORY_CARE_PROVIDER_SITE_OTHER): Payer: Self-pay

## 2018-12-30 ENCOUNTER — Encounter (INDEPENDENT_AMBULATORY_CARE_PROVIDER_SITE_OTHER): Payer: Self-pay

## 2018-12-31 ENCOUNTER — Encounter (INDEPENDENT_AMBULATORY_CARE_PROVIDER_SITE_OTHER): Payer: Self-pay

## 2019-01-01 ENCOUNTER — Encounter (INDEPENDENT_AMBULATORY_CARE_PROVIDER_SITE_OTHER): Payer: Self-pay

## 2019-01-01 ENCOUNTER — Other Ambulatory Visit: Payer: Self-pay

## 2019-01-01 DIAGNOSIS — Z20822 Contact with and (suspected) exposure to covid-19: Secondary | ICD-10-CM

## 2019-01-02 ENCOUNTER — Encounter (INDEPENDENT_AMBULATORY_CARE_PROVIDER_SITE_OTHER): Payer: Self-pay

## 2019-01-03 LAB — NOVEL CORONAVIRUS, NAA: SARS-CoV-2, NAA: NOT DETECTED

## 2019-01-09 DIAGNOSIS — Z3169 Encounter for other general counseling and advice on procreation: Secondary | ICD-10-CM | POA: Diagnosis not present

## 2019-01-09 DIAGNOSIS — N926 Irregular menstruation, unspecified: Secondary | ICD-10-CM | POA: Diagnosis not present

## 2019-02-04 DIAGNOSIS — J343 Hypertrophy of nasal turbinates: Secondary | ICD-10-CM | POA: Diagnosis not present

## 2019-02-04 DIAGNOSIS — J3489 Other specified disorders of nose and nasal sinuses: Secondary | ICD-10-CM | POA: Diagnosis not present

## 2019-02-04 DIAGNOSIS — J310 Chronic rhinitis: Secondary | ICD-10-CM | POA: Diagnosis not present

## 2019-02-04 DIAGNOSIS — J309 Allergic rhinitis, unspecified: Secondary | ICD-10-CM | POA: Diagnosis not present

## 2019-02-04 DIAGNOSIS — J323 Chronic sphenoidal sinusitis: Secondary | ICD-10-CM | POA: Diagnosis not present

## 2019-02-26 ENCOUNTER — Encounter: Payer: Self-pay | Admitting: Family

## 2019-02-26 ENCOUNTER — Other Ambulatory Visit: Payer: Self-pay | Admitting: Family

## 2019-02-28 DIAGNOSIS — Z23 Encounter for immunization: Secondary | ICD-10-CM | POA: Diagnosis not present

## 2019-03-23 ENCOUNTER — Other Ambulatory Visit: Payer: Self-pay | Admitting: Family

## 2019-03-27 ENCOUNTER — Encounter: Payer: Self-pay | Admitting: Internal Medicine

## 2019-03-27 ENCOUNTER — Other Ambulatory Visit: Payer: Self-pay

## 2019-03-27 ENCOUNTER — Ambulatory Visit (INDEPENDENT_AMBULATORY_CARE_PROVIDER_SITE_OTHER): Payer: BC Managed Care – PPO | Admitting: Internal Medicine

## 2019-03-27 VITALS — BP 124/74 | HR 69 | Temp 97.9°F | Wt 204.0 lb

## 2019-03-27 DIAGNOSIS — B86 Scabies: Secondary | ICD-10-CM | POA: Diagnosis not present

## 2019-03-27 MED ORDER — PERMETHRIN 5 % EX CREA: 1.0000 "application " | TOPICAL_CREAM | Freq: Once | CUTANEOUS | 0 refills | Status: AC

## 2019-03-27 NOTE — Patient Instructions (Signed)
Scabies, Adult  Scabies is a skin condition that happens when very small insects get under the skin (infestation). This causes a rash and severe itchiness. Scabies can spread from person to person (is contagious). If you get scabies, it is common for others in your household to get scabies too. With proper treatment, symptoms usually go away in 2-4 weeks. Scabies usually does not cause lasting problems. What are the causes? This condition is caused by tiny mites (Sarcoptes scabiei, or human itch mites) that can only be seen with a microscope. The mites get into the top layer of skin and lay eggs. Scabies can spread from person to person through:  Close contact with a person who has scabies.  Sharing or having contact with infested items, such as towels, bedding, or clothing. What increases the risk? The following factors may make you more likely to develop this condition:  Living in a nursing home or other extended care facility.  Having sexual contact with a partner who has scabies.  Caring for others who are at increased risk for scabies. What are the signs or symptoms? Symptoms of this condition include:  Severe itchiness. This is often worse at night.  A rash that includes tiny red bumps or blisters. The rash commonly occurs on the hands, wrists, elbows, armpits, chest, waist, groin, or buttocks. The bumps may form a line (burrow) in some areas.  Skin irritation. This can include scaly patches or sores. How is this diagnosed? This condition may be diagnosed based on:  A physical exam of the skin.  A skin test. Your health care provider may take a sample of your affected skin (skin scraping) and have it examined under a microscope for signs of mites. How is this treated? This condition may be treated with:  Medicated cream or lotion that kills the mites. This is spread on the entire body and left on for several hours. Usually, one treatment with medicated cream or lotion is  enough to kill all the mites. In severe cases, the treatment may need to be repeated.  Medicated cream that relieves itching.  Medicines taken by mouth (orally) that: ? Relieve itching. ? Reduce the swelling and redness. ? Kill the mites. This treatment may be done in severe cases. Follow these instructions at home: Medicines   Take or apply over-the-counter and prescription medicines as told by your health care provider.  Apply medicated cream or lotion as told by your health care provider.  Do not wash off the medicated cream or lotion until the necessary amount of time has passed. Skin care   Avoid scratching the affected areas of your skin.  Keep your fingernails closely trimmed to reduce injury from scratching.  Take cool baths or apply cool washcloths to your skin to help reduce itching. General instructions  Clean all items that you recently had contact with, including bedding, clothing, and furniture. Do this on the same day that you start treatment. ? Dry clean items, or use hot water to wash items. Dry items on the hot dry cycle. ? Place items that cannot be washed into closed, airtight plastic bags for at least 3 days. The mites cannot live for more than 3 days away from human skin. ? Vacuum furniture and mattresses that you use.  Make sure that other people who may have been infested are examined by a health care provider. These include members of your household and anyone who may have had contact with infested items.  Keep all follow-up  visits as told by your health care provider. This is important. Contact a health care provider if:  You have itching that does not go away after 4 weeks of treatment.  You continue to develop new bumps or burrows.  You have redness, swelling, or pain in your rash area after treatment.  You have fluid, blood, or pus coming from your rash. Summary  Scabies is a skin condition that causes a rash and severe itchiness.  This  condition is caused by tiny mites that get into the top layer of the skin and lay eggs.  Scabies can spread from person to person.  Follow treatments as recommended by your health care provider.  Clean all items that you recently had contact with. This information is not intended to replace advice given to you by your health care provider. Make sure you discuss any questions you have with your health care provider. Document Revised: 01/02/2018 Document Reviewed: 01/02/2018 Elsevier Patient Education  2020 [employer]vier Inc.  

## 2019-03-27 NOTE — Progress Notes (Signed)
Subjective:    Patient ID: Debra Wood, female    DOB: Aug 15, 1990, 29 y.o.   MRN: [id number]  HPI  Pt presents to the clinic today with c/o a rash. This started 1 day ago. The rash started in between her shoulder blades and has spread to her shoulders, down both arms, and to right hip. She describes the rash as small red dots which are very itchy and worse at night. She was exposed to scabies 3 days ago. She has tried oral Benadryl, topical Benadryl and a Hydrocortisone cream with no relief. She denies changes in soaps, lotions or detergents. No one in her home has a similar rash.  Review of Systems      Past Medical History:  Diagnosis Date  . Chicken pox   . No known health problems     Current Outpatient Medications  Medication Sig Dispense Refill  . benzonatate (TESSALON) 100 MG capsule Take 1 capsule (100 mg total) by mouth 2 (two) times daily as needed for cough. 20 capsule 0  . fexofenadine (ALLEGRA) 180 MG tablet TAKE 1 TABLET BY MOUTH EVERY DAY 90 tablet 1  . fluticasone (FLONASE) 50 MCG/ACT nasal spray Place 2 sprays into both nostrils daily. 16 g 6  . fluticasone (FLONASE) 50 MCG/ACT nasal spray Place 1 spray into both nostrils daily. 16 g 0  . norgestimate-ethinyl estradiol (ORTHO-CYCLEN,SPRINTEC,PREVIFEM) 0.25-35 MG-MCG tablet Take 1 tablet by mouth daily.    . phentermine (ADIPEX-P) 37.5 MG tablet Take 1 tablet (37.5 mg total) by mouth daily before breakfast. 30 tablet 0  . predniSONE (DELTASONE) 20 MG tablet Take 40 mg (2 tablets) by mouth daily for 7 days, then take 20 mg (1 tablet) by mouth daily for 4 days, then take 10 mg (half a tablet) by mouth daily for 4 days 20 tablet 0   No current facility-administered medications for this visit.    No Known Allergies  Family History  Problem Relation Age of Onset  . Breast cancer Maternal Grandmother        40's-50's pt unsure  . Breast cancer Paternal Grandmother 58       had 2 seperate times    Social  History   Socioeconomic History  . Marital status: Married    Spouse name: Not on file  . Number of children: Not on file  . Years of education: Not on file  . Highest education level: Not on file  Occupational History  . Occupation: customer service  Tobacco Use  . Smoking status: Never Smoker  . Smokeless tobacco: Never Used  Substance and Sexual Activity  . Alcohol use: Yes    Alcohol/week: 2.0 - 3.0 standard drinks    Types: 2 - 3 Standard drinks or equivalent per week  . Drug use: No  . Sexual activity: Yes    Birth control/protection: Pill  Other Topics Concern  . 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 Difficulty of Paying Living Expenses: Not on file  Food Insecurity:   . Worried About Charity fundraiser in the Last Year: Not on file  . Ran Out of Food in the Last Year: Not on file  Transportation Needs:   . Lack of Transportation (Medical): Not on file  . Lack of Transportation (Non-Medical): Not on file  Physical Activity:   . Days of Exercise per Week: Not on file  .  Minutes of Exercise per Session: Not on file  Stress:   . Feeling of Stress : Not on file  Social Connections:   . Frequency of Communication with Friends and Family: Not on file  . Frequency of Social Gatherings with Friends and Family: Not on file  . Attends Religious Services: Not on file  . Active Member of Clubs or Organizations: Not on file  . Attends Archivist Meetings: Not on file  . Marital Status: Not on file  Intimate Partner Violence:   . Fear of Current or Ex-Partner: Not on file  . Emotionally Abused: Not on file  . Physically Abused: Not on file  . Sexually Abused: Not on file     Constitutional: Denies fever, malaise, fatigue, or headache. Skin: Pt reports rash of red dots which itch. Denies lesions or ulcercations.   No other specific complaints in a complete  review of systems (except as listed in HPI above).  Objective:   Physical Exam   BP 124/74   Pulse 69   Temp 97.9 F (36.6 C) (Temporal)   Wt 204 lb (92.5 kg)   SpO2 98%   BMI 35.02 kg/m   Wt Readings from Last 3 Encounters:  05/28/18 207 lb (93.9 kg)  04/24/18 207 lb (93.9 kg)  04/23/18 205 lb 12 oz (93.3 kg)    General: Appears her stated age, obese, in NAD.  Skin: Scattered red bites each 50mm in diameter on upper back and both forearms, 7 bites total noted. No burrows noted. Scratch marks noted on back.  Neurological: Alert and oriented.  Psychiatric: Mood and affect normal. Behavior is normal. Judgment and thought content normal.    BMET    Component Value Date/Time   NA 138 02/13/2018 1602   K 4.2 02/13/2018 1602   CL 104 02/13/2018 1602   CO2 26 02/13/2018 1602   GLUCOSE 91 02/13/2018 1602   BUN 10 02/13/2018 1602   CREATININE 0.94 02/13/2018 1602   CALCIUM 9.1 02/13/2018 1602    Lipid Panel     Component Value Date/Time   CHOL 152 03/04/2015 1048   TRIG 48.0 03/04/2015 1048   HDL 51.00 03/04/2015 1048   CHOLHDL 3 03/04/2015 1048   VLDL 9.6 03/04/2015 1048   LDLCALC 92 03/04/2015 1048    CBC    Component Value Date/Time   WBC 8.8 02/13/2018 1602   RBC 4.33 02/13/2018 1602   HGB 14.1 02/13/2018 1602   HCT 41.3 02/13/2018 1602   PLT 352.0 02/13/2018 1602   MCV 95.4 02/13/2018 1602   MCHC 34.2 02/13/2018 1602   RDW 12.6 02/13/2018 1602   LYMPHSABS 3.1 02/13/2018 1602   MONOABS 0.6 02/13/2018 1602   EOSABS 0.2 02/13/2018 1602   BASOSABS 0.1 02/13/2018 1602    Hgb A1C Lab Results  Component Value Date   HGBA1C 4.9 02/13/2018           Assessment & Plan:  Scabies :  Rx sent for Permethrin 5% cream.  Patient advised to cover cream over entire body and let sit for 10 minutes before rinsing. Patient advised to wash clothing and bedding in hot water.   RTC if rash persists after Permethrin use.    Webb Silversmith, NP This visit  occurred during the SARS-CoV-2 public health emergency.  Safety protocols were in place, including screening questions prior to the visit, additional usage of staff PPE, and extensive cleaning of exam room while observing appropriate contact time as indicated for disinfecting solutions.

## 2019-03-31 ENCOUNTER — Encounter: Payer: Self-pay | Admitting: Family

## 2019-04-04 ENCOUNTER — Encounter: Payer: Self-pay | Admitting: Family

## 2019-04-04 ENCOUNTER — Ambulatory Visit (INDEPENDENT_AMBULATORY_CARE_PROVIDER_SITE_OTHER): Payer: BC Managed Care – PPO | Admitting: Family

## 2019-04-04 DIAGNOSIS — J302 Other seasonal allergic rhinitis: Secondary | ICD-10-CM

## 2019-04-04 DIAGNOSIS — G43709 Chronic migraine without aura, not intractable, without status migrainosus: Secondary | ICD-10-CM

## 2019-04-04 NOTE — Progress Notes (Signed)
Virtual Visit via Video Note  I connected with@  on 04/04/19 at  1:00 PM EST by a video enabled telemedicine application and verified that I am speaking with the correct person using two identifiers.  Location patient: home Location provider:work  Persons participating in the virtual visit: patient, provider  I discussed the limitations of evaluation and management by telemedicine and the availability of in person appointments. The patient expressed understanding and agreed to proceed.   HPI: Follow up, TOC  Feels well. Coping well with pandemic.   Allergies- would like allegra reflled for chronic sinus congestion. H/o sinus surgery and follows with ENT.   HA- h/o of migraine prior to pituitary tumor, improved initially after pituitary resection however of recently has returned, not as severe as has been. HA typically on left side. Dr Georgianne Fick initially orderd MRI Brain which saw the tumor. No vision loss during HA.  Noticed 'glares' in peripheral vision and experiences photophobia. During HA may also get dizzy, lightheaded, nausea. No  v, fever.  BC Powder does help.   Would like to consider starting a family and off the OCP, for 2 months. Follows with GYN at [facility]. On PNV.  09/05/18 pituitary resection Dr Tennis Must (neurosurgery) , sinus surgery ; follows with Dr Burnard Bunting ( ENT)  Repeat MRI 04/23/19. ( MRI Brain last 10/2018)  ROS: See pertinent positives and negatives per HPI.  Past Medical History:  Diagnosis Date  . Chicken pox   . No known health problems     Past Surgical History:  Procedure Laterality Date  . PITUITARY SURGERY  08/12/2018   Dr Tennis Must and Dr Burnard Bunting  . TONSILLECTOMY AND ADENOIDECTOMY  2009    Family History  Problem Relation Age of Onset  . Breast cancer Maternal Grandmother        40's-50's pt unsure  . Breast cancer Paternal Grandmother 30       had 2 seperate times    SOCIAL HX: never smoker   Current Outpatient Medications:  .   fexofenadine (ALLEGRA) 180 MG tablet, TAKE 1 TABLET BY MOUTH EVERY DAY, Disp: 90 tablet, Rfl: 1 .  fluticasone (FLONASE) 50 MCG/ACT nasal spray, Place 2 sprays into both nostrils daily., Disp: 16 g, Rfl: 6  EXAM:  VITALS per patient if applicable: Wt Readings from Last 3 Encounters:  03/27/19 204 lb (92.5 kg)  05/28/18 207 lb (93.9 kg)  04/24/18 207 lb (93.9 kg)   There is no height or weight on file to calculate BMI.  BMI 35. BP Readings from Last 3 Encounters:  03/27/19 124/74  05/28/18 118/60  04/24/18 138/80    GENERAL: alert, oriented, appears well and in no acute distress  HEENT: atraumatic, conjunttiva clear, no obvious abnormalities on inspection of external nose and ears  NECK: normal movements of the head and neck  LUNGS: on inspection no signs of respiratory distress, breathing rate appears normal, no obvious gross SOB, gasping or wheezing  CV: no obvious cyanosis  MS: moves all visible extremities without noticeable abnormality  PSYCH/NEURO: pleasant and cooperative, no obvious depression or anxiety, speech and thought processing grossly intact  ASSESSMENT AND PLAN:  Discussed the following assessment and plan:  Chronic migraine without aura without status migrainosus, not intractable  Seasonal allergies Problem List Items Addressed This Visit      Cardiovascular and Mediastinum   Migraine    Overall improvement since pituitary resecction, though some regression over past several weeks. Responds to [facility] powder. HA feels similar to  HA in the past. Declines MRI brain than sooner as planned with Dr Burnard Bunting, 04/23/19. Will follow.         Other   Seasonal allergies    Doing well on allegra. Will continue         -we discussed possible serious and likely etiologies, options for evaluation and workup, limitations of telemedicine visit vs in person visit, treatment, treatment risks and precautions. Pt prefers to treat via telemedicine empirically rather then  risking or undertaking an in person visit at this moment. Patient agrees to seek prompt in person care if worsening, new symptoms arise, or if is not improving with treatment.   I discussed the assessment and treatment plan with the patient. The patient was provided an opportunity to ask questions and all were answered. The patient agreed with the plan and demonstrated an understanding of the instructions.   The patient was advised to call back or seek an in-person evaluation if the symptoms worsen or if the condition fails to improve as anticipated.   Mable Paris, FNP

## 2019-04-04 NOTE — Assessment & Plan Note (Addendum)
Overall improvement since pituitary resecction, though some regression over past several weeks. Responds to [facility] powder. HA feels similar to HA in the past. Declines MRI brain than sooner as planned with Dr Burnard Bunting, 04/23/19. Will follow.

## 2019-04-04 NOTE — Assessment & Plan Note (Addendum)
Doing well on allegra. Will continue

## 2019-04-23 DIAGNOSIS — E236 Other disorders of pituitary gland: Secondary | ICD-10-CM | POA: Diagnosis not present

## 2019-04-23 DIAGNOSIS — D352 Benign neoplasm of pituitary gland: Secondary | ICD-10-CM | POA: Diagnosis not present

## 2019-04-23 DIAGNOSIS — J323 Chronic sphenoidal sinusitis: Secondary | ICD-10-CM | POA: Diagnosis not present

## 2019-04-23 DIAGNOSIS — D497 Neoplasm of unspecified behavior of endocrine glands and other parts of nervous system: Secondary | ICD-10-CM | POA: Diagnosis not present

## 2019-04-23 DIAGNOSIS — J3089 Other allergic rhinitis: Secondary | ICD-10-CM | POA: Diagnosis not present

## 2019-04-23 DIAGNOSIS — Z9889 Other specified postprocedural states: Secondary | ICD-10-CM | POA: Diagnosis not present

## 2019-04-23 DIAGNOSIS — J3489 Other specified disorders of nose and nasal sinuses: Secondary | ICD-10-CM | POA: Diagnosis not present

## 2019-04-30 ENCOUNTER — Encounter: Payer: Self-pay | Admitting: Family

## 2019-05-15 DIAGNOSIS — Z3169 Encounter for other general counseling and advice on procreation: Secondary | ICD-10-CM | POA: Diagnosis not present

## 2019-05-15 DIAGNOSIS — N926 Irregular menstruation, unspecified: Secondary | ICD-10-CM | POA: Diagnosis not present

## 2019-06-02 ENCOUNTER — Telehealth (INDEPENDENT_AMBULATORY_CARE_PROVIDER_SITE_OTHER): Payer: BC Managed Care – PPO | Admitting: Family

## 2019-06-02 ENCOUNTER — Other Ambulatory Visit: Payer: Self-pay

## 2019-06-02 ENCOUNTER — Encounter: Payer: Self-pay | Admitting: Family

## 2019-06-02 VITALS — Ht 64.0 in | Wt 204.0 lb

## 2019-06-02 DIAGNOSIS — K219 Gastro-esophageal reflux disease without esophagitis: Secondary | ICD-10-CM | POA: Diagnosis not present

## 2019-06-02 DIAGNOSIS — L74510 Primary focal hyperhidrosis, axilla: Secondary | ICD-10-CM | POA: Insufficient documentation

## 2019-06-02 MED ORDER — OMEPRAZOLE 40 MG PO CPDR: 40.0000 mg | DELAYED_RELEASE_CAPSULE | Freq: Every day | ORAL | 3 refills | Status: DC

## 2019-06-02 NOTE — Assessment & Plan Note (Signed)
Worsened of late.  We will go ahead and increase Prilosec to 40 mg once per day for short-term as I explained to patient.  She will wean off this medication in time, and advised her to use Pepcid AC more ongoing, if needed.  Close follow-up

## 2019-06-02 NOTE — Assessment & Plan Note (Signed)
Chronic, some worsening since pituitary adenoma resection.  Reviewed recent lab work done with endocrine, current studies appeared normal.  Deferred further lab work at this time.  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  She is exhausted antiperspirants.  Will further dermatology for further treatment.

## 2019-06-02 NOTE — Progress Notes (Signed)
Virtual Visit via Video Note  I connected with@  on 06/02/19 at  8:30 AM EDT by a video enabled telemedicine application and verified that I am speaking with the correct person using two identifiers.  Location patient: home Location provider:work  Persons participating in the virtual visit: patient, provider  I discussed the limitations of evaluation and management by telemedicine and the availability of in person appointments. The patient expressed understanding and agreed to proceed.   HPI: Complains of excessive sweatiness This has been chronic since middle school , feels like has worsened since pituitary surgery.  20 mens clinical old spice and regular deodorant. No related to blood sugar.  Complains of odor from axillary. No syncope, vision changes.   GERD- worse the past 2 weeks. Has limited spicy foods, sodas. Taking otc prilosec first thing in the morning prior to breakfast. Endorses nausea, vomiting. No abdominal pain, fever, diarrhea. Tried aliseltzer gum, tums with relief.   S/p Pituitary adenoma resection 08/2018 -Dr. Wendee Beavers, ENT.  Given Levaquin, Medrol Dosepak.  Recommended Bactroban mist. Repeat MRI brain 04/23/19.   Dr Leslye Peer - 04/2019 Cortisol, estradiol, LH, prolactin, TSH, FT4.  A1c 02/2018 4.9   ROS: See pertinent positives and negatives per HPI.  Past Medical History:  Diagnosis Date  . Chicken pox   . No known health problems     Past Surgical History:  Procedure Laterality Date  . PITUITARY SURGERY  08/12/2018   Dr Tennis Must and Dr Burnard Bunting  . TONSILLECTOMY AND ADENOIDECTOMY  2009    Family History  Problem Relation Age of Onset  . Breast cancer Maternal Grandmother        40's-50's pt unsure  . Breast cancer Paternal Grandmother 11       had 2 seperate times     Current Outpatient Medications:  .  azelastine (ASTELIN) 0.1 % nasal spray, Place 1 spray into both nostrils 2 (two) times daily. , Disp: , Rfl:  .  fexofenadine (ALLEGRA) 180 MG  tablet, TAKE 1 TABLET BY MOUTH EVERY DAY, Disp: 90 tablet, Rfl: 1 .  mometasone (NASONEX) 50 MCG/ACT nasal spray, Place 2 sprays into the nose daily., Disp: , Rfl:  .  omeprazole (PRILOSEC) 20 MG capsule, Take 20 mg by mouth daily., Disp: , Rfl:   EXAM:  VITALS per patient if applicable: BP Readings from Last 3 Encounters:  03/27/19 124/74  05/28/18 118/60  04/24/18 138/80    Wt Readings from Last 3 Encounters:  06/02/19 204 lb (92.5 kg)  03/27/19 204 lb (92.5 kg)  05/28/18 207 lb (93.9 kg)    GENERAL: alert, oriented, appears well and in no acute distress  HEENT: atraumatic, conjunttiva clear, no obvious abnormalities on inspection of external nose and ears  NECK: normal movements of the head and neck  LUNGS: on inspection no signs of respiratory distress, breathing rate appears normal, no obvious gross SOB, gasping or wheezing  CV: no obvious cyanosis  MS: moves all visible extremities without noticeable abnormality  PSYCH/NEURO: pleasant and cooperative, no obvious depression or anxiety, speech and thought processing grossly intact  ASSESSMENT AND PLAN:  Discussed the following assessment and plan:  No diagnosis found.  -we discussed possible serious and likely etiologies, options for evaluation and workup, limitations of telemedicine visit vs in person visit, treatment, treatment risks and precautions. Pt prefers to treat via telemedicine empirically rather then risking or undertaking an in person visit at this moment. Patient agrees to seek prompt in person care if worsening, new  symptoms arise, or if is not improving with treatment.   I discussed the assessment and treatment plan with the patient. The patient was provided an opportunity to ask questions and all were answered. The patient agreed with the plan and demonstrated an understanding of the instructions.   The patient was advised to call back or seek an in-person evaluation if the symptoms worsen or if the  condition fails to improve as anticipated.   Mable Paris, FNP

## 2019-06-02 NOTE — Patient Instructions (Addendum)
SHORT course of prilosec then wean OFF prilosec as discussed.  I have sent in Prilosec 40 mg to [facility] for you.  Long term use beyond 3 months of proton pump inhibitors , also called PPI's, is associated with malabsorption of vitamins, chronic kidney disease, fracture risk, and diarrheal illnesses. PPI's include Nexium, Prilosec, Protonix, Dexilant, and Prevacid.   I generally recommend trying to control acid reflux with lifestyle modifications including avoiding trigger foods, not eating 2 hours prior to bedtime. You may use histamine 2 blockers daily to twice daily ( this is Pepcid) and then when symptoms flare, start back on PPI for short course. PLEASE NOTE: Zantac is a histamine blocker like Pepcid and the FDA has requested that Zantac be taken OFF THE MARKET. Please do NOT take this medication as there are concerns that it is cancer causing.   Of note, we will need to do an endoscopy ( upper GI) to evaluate your esophagus, stomach in the future if acid reflux persists are you develop red flag symptoms: trouble swallowing, hoarseness, chronic cough, unexplained weight loss.   Referral to dermatology-please give the office a call if you will hear from [place] in regards to this appointment.   Nice to see you !

## 2019-06-04 ENCOUNTER — Ambulatory Visit: Payer: BC Managed Care – PPO | Admitting: Family

## 2019-06-18 DIAGNOSIS — Z3169 Encounter for other general counseling and advice on procreation: Secondary | ICD-10-CM | POA: Diagnosis not present

## 2019-06-18 DIAGNOSIS — N926 Irregular menstruation, unspecified: Secondary | ICD-10-CM | POA: Diagnosis not present

## 2019-06-24 DIAGNOSIS — Z3169 Encounter for other general counseling and advice on procreation: Secondary | ICD-10-CM | POA: Diagnosis not present

## 2019-07-14 DIAGNOSIS — Z3169 Encounter for other general counseling and advice on procreation: Secondary | ICD-10-CM | POA: Diagnosis not present

## 2019-08-06 DIAGNOSIS — Z3169 Encounter for other general counseling and advice on procreation: Secondary | ICD-10-CM | POA: Diagnosis not present

## 2019-08-24 ENCOUNTER — Other Ambulatory Visit: Payer: Self-pay | Admitting: Family

## 2019-08-24 DIAGNOSIS — K219 Gastro-esophageal reflux disease without esophagitis: Secondary | ICD-10-CM

## 2019-09-01 DIAGNOSIS — N926 Irregular menstruation, unspecified: Secondary | ICD-10-CM | POA: Diagnosis not present

## 2019-09-03 DIAGNOSIS — J3489 Other specified disorders of nose and nasal sinuses: Secondary | ICD-10-CM | POA: Diagnosis not present

## 2019-09-03 DIAGNOSIS — J310 Chronic rhinitis: Secondary | ICD-10-CM | POA: Diagnosis not present

## 2019-09-08 DIAGNOSIS — Z3201 Encounter for pregnancy test, result positive: Secondary | ICD-10-CM | POA: Diagnosis not present

## 2019-09-10 DIAGNOSIS — Z3201 Encounter for pregnancy test, result positive: Secondary | ICD-10-CM | POA: Diagnosis not present

## 2019-09-29 ENCOUNTER — Encounter: Payer: Self-pay | Admitting: Family

## 2019-10-01 ENCOUNTER — Other Ambulatory Visit: Payer: Self-pay | Admitting: Family

## 2019-10-02 ENCOUNTER — Telehealth: Payer: BC Managed Care – PPO

## 2019-10-02 ENCOUNTER — Other Ambulatory Visit: Payer: Self-pay

## 2019-10-02 ENCOUNTER — Ambulatory Visit
Admission: RE | Admit: 2019-10-02 | Discharge: 2019-10-02 | Disposition: A | Discharge status: Home / Self Care | Payer: BC Managed Care – PPO | Source: Ambulatory Visit | Attending: Emergency Medicine | Admitting: Emergency Medicine

## 2019-10-02 VITALS — BP 130/82 | HR 94 | Temp 98.8°F | Resp 18 | Ht 64.0 in | Wt 210.0 lb

## 2019-10-02 DIAGNOSIS — Z3A08 8 weeks gestation of pregnancy: Secondary | ICD-10-CM

## 2019-10-02 DIAGNOSIS — J302 Other seasonal allergic rhinitis: Secondary | ICD-10-CM | POA: Diagnosis not present

## 2019-10-02 DIAGNOSIS — J3489 Other specified disorders of nose and nasal sinuses: Secondary | ICD-10-CM | POA: Diagnosis not present

## 2019-10-02 NOTE — ED Triage Notes (Signed)
Pt reports having nasal drainage that began on Tuesday. Pt sts she is [redacted]wks pregnant and would like to make sure its fine to take OTC congestion meds. Also employer requiring a covid test.

## 2019-10-02 NOTE — Discharge Instructions (Signed)
Take Tylenol as needed for discomfort.  Take Mucinex as needed for congestion.    Follow-up with your OB/GYN as scheduled next week.    Your COVID test is pending.  You should self quarantine until the test result is back.

## 2019-10-02 NOTE — ED Provider Notes (Signed)
[name]    CSN: [id number] Arrival date & time: 10/02/19  1038      History   Chief Complaint Chief Complaint  Patient presents with  . Nasal Congestion    HPI Debra Wood is a 29 y.o. female.   Patient presents with congestion and nasal drainage x2 days.  She reports she gets these symptoms 2x/year every year since childhood.  She is [redacted] weeks pregnant and would like to know what medication she can take for her symptoms; she usually takes Mucinex.  She also states her work is requiring a COVID test.  She denies fever, chills, sore throat, cough, shortness of breath, abdominal pain, vomiting, diarrhea, rash, or other symptoms.  No treatments attempted at home.  The history is provided by the patient.    Past Medical History:  Diagnosis Date  . Chicken pox   . No known health problems     Patient Active Problem List   Diagnosis Date Noted  . Primary focal hyperhidrosis of axilla 06/02/2019  . Pituitary tumor 08/26/2018  . GERD (gastroesophageal reflux disease) 08/26/2018  . BMI 33.0-33.9,adult 08/26/2018  . Trochanteric bursitis of right hip 07/04/2017  . Obesity (BMI 30.0-34.9) 04/16/2015    Past Surgical History:  Procedure Laterality Date  . PITUITARY SURGERY  08/12/2018   Dr Tennis Must and Dr Burnard Bunting  . TONSILLECTOMY AND ADENOIDECTOMY  2009    OB History    Gravida  1   Para      Term      Preterm      AB      Living        SAB      TAB      Ectopic      Multiple      Live Births               Home Medications    Prior to Admission medications   Medication Sig Start Date End Date Taking? Authorizing Provider  azelastine (ASTELIN) 0.1 % nasal spray Place 1 spray into both nostrils 2 (two) times daily.  02/04/19 02/04/20  [provider]  fexofenadine (ALLEGRA) 180 MG tablet TAKE 1 TABLET BY MOUTH EVERY DAY 10/01/19   Burnard Hawthorne, FNP  mometasone (NASONEX) 50 MCG/ACT nasal spray Place 2 sprays into the  nose daily.    [provider]  omeprazole (PRILOSEC) 40 MG capsule TAKE 1 CAPSULE BY MOUTH EVERY DAY 08/25/19   Burnard Hawthorne, FNP    Family History Family History  Problem Relation Age of Onset  . Breast cancer Maternal Grandmother        40's-50's pt unsure  . Breast cancer Paternal Grandmother 60       had 2 seperate times    Social History Social History   Tobacco Use  . Smoking status: Never Smoker  . Smokeless tobacco: Never Used  Vaping Use  . Vaping Use: Never used  Substance Use Topics  . Alcohol use: Not Currently    Alcohol/week: 2.0 - 3.0 standard drinks    Types: 2 - 3 Standard drinks or equivalent per week  . Drug use: No     Allergies   Patient has no known allergies.   Review of Systems Review of Systems  Constitutional: Negative for chills and fever.  HENT: Positive for congestion and postnasal drip. Negative for ear pain and sore throat.   Eyes: Negative for pain and visual disturbance.  Respiratory: Negative for cough  and shortness of breath.   Cardiovascular: Negative for chest pain and palpitations.  Gastrointestinal: Negative for abdominal pain, diarrhea, nausea and vomiting.  Genitourinary: Negative for dysuria and hematuria.  Musculoskeletal: Negative for arthralgias and back pain.  Skin: Negative for color change and rash.  Neurological: Negative for seizures and syncope.  All other systems reviewed and are negative.    Physical Exam Triage Vital Signs ED Triage Vitals  Enc Vitals Group     BP 10/02/19 1045 (!) 130/8     Pulse Rate 10/02/19 1045 94     Resp 10/02/19 1045 18     Temp 10/02/19 1045 98.8 F (37.1 C)     Temp Source 10/02/19 1045 Oral     SpO2 10/02/19 1045 98 %     Weight 10/02/19 1047 (!) 210 lb (95.3 kg)     Height 10/02/19 1047 5\' 4"  (1.626 m)     Head Circumference --      Peak Flow --      Pain Score 10/02/19 1047 0     Pain Loc --      Pain Edu? --      Excl. in [place]? --    No data  found.  Updated Vital Signs BP (!) 130/82   Pulse 94   Temp 98.8 F (37.1 C) (Oral)   Resp 18   Ht 5\' 4"  (1.626 m)   Wt (!) 210 lb (95.3 kg)   SpO2 98%   BMI 36.05 kg/m   Visual Acuity Right Eye Distance:   Left Eye Distance:   Bilateral Distance:    Right Eye Near:   Left Eye Near:    Bilateral Near:     Physical Exam Vitals and nursing note reviewed.  Constitutional:      General: She is not in acute distress.    Appearance: She is well-developed. She is not ill-appearing.  HENT:     Head: Normocephalic and atraumatic.     Right Ear: Tympanic membrane normal.     Left Ear: Tympanic membrane normal.     Nose: Congestion present.     Mouth/Throat:     Mouth: Mucous membranes are moist.     Pharynx: Oropharynx is clear.  Eyes:     Conjunctiva/sclera: Conjunctivae normal.  Cardiovascular:     Rate and Rhythm: Normal rate and regular rhythm.     Heart sounds: No murmur heard.   Pulmonary:     Effort: Pulmonary effort is normal. No respiratory distress.     Breath sounds: Normal breath sounds.  Abdominal:     Palpations: Abdomen is soft.     Tenderness: There is no abdominal tenderness. There is no guarding or rebound.  Musculoskeletal:     Cervical back: Neck supple.  Skin:    General: Skin is warm and dry.     Findings: No rash.  Neurological:     General: No focal deficit present.     Mental Status: She is alert and oriented to person, place, and time.     Gait: Gait normal.  Psychiatric:        Mood and Affect: Mood normal.        Behavior: Behavior normal.      UC Treatments / Results  Labs (all labs ordered are listed, but only abnormal results are displayed) Labs Reviewed  NOVEL CORONAVIRUS, NAA    EKG   Radiology No results found.  Procedures Procedures (including critical care time)  Medications Ordered in UC  Medications - No data to display  Initial Impression / Assessment and Plan / UC Course  I have reviewed the triage vital  signs and the nursing notes.  Pertinent labs & imaging results that were available during my care of the patient were reviewed by me and considered in my medical decision making (see chart for details).   Seasonal allergies.  [redacted] weeks pregnant.  Patient has an appointment with her OB/GYN next week.  Instructed her to take Tylenol as needed for discomfort and Mucinex as needed for congestion.  PCR COVID test pending.  Instructed her to self quarantine until the test result is back.  Patient agrees to plan of care.      Final Clinical Impressions(s) / UC Diagnoses   Final diagnoses:  Seasonal allergies  [redacted] weeks gestation of pregnancy     Discharge Instructions     Take Tylenol as needed for discomfort.  Take Mucinex as needed for congestion.    Follow-up with your OB/GYN as scheduled next week.    Your COVID test is pending.  You should self quarantine until the test result is back.       ED Prescriptions    None     PDMP not reviewed this encounter.   Sharion Balloon, NP 10/02/19 1113

## 2019-10-03 ENCOUNTER — Ambulatory Visit: Payer: BC Managed Care – PPO | Admitting: Family

## 2019-10-03 LAB — NOVEL CORONAVIRUS, NAA: SARS-CoV-2, NAA: NOT DETECTED

## 2019-10-03 LAB — SARS-COV-2, NAA 2 DAY TAT

## 2019-10-09 DIAGNOSIS — N925 Other specified irregular menstruation: Secondary | ICD-10-CM | POA: Diagnosis not present

## 2019-10-09 DIAGNOSIS — Z3A08 8 weeks gestation of pregnancy: Secondary | ICD-10-CM | POA: Diagnosis not present

## 2019-10-09 DIAGNOSIS — Z3400 Encounter for supervision of normal first pregnancy, unspecified trimester: Secondary | ICD-10-CM | POA: Diagnosis not present

## 2019-10-09 DIAGNOSIS — Z79899 Other long term (current) drug therapy: Secondary | ICD-10-CM | POA: Diagnosis not present

## 2019-10-09 DIAGNOSIS — O30001 Twin pregnancy, unspecified number of placenta and unspecified number of amniotic sacs, first trimester: Secondary | ICD-10-CM | POA: Diagnosis not present

## 2019-10-09 DIAGNOSIS — Z8349 Family history of other endocrine, nutritional and metabolic diseases: Secondary | ICD-10-CM | POA: Diagnosis not present

## 2019-10-09 DIAGNOSIS — Z1151 Encounter for screening for human papillomavirus (HPV): Secondary | ICD-10-CM | POA: Diagnosis not present

## 2019-10-09 DIAGNOSIS — Z124 Encounter for screening for malignant neoplasm of cervix: Secondary | ICD-10-CM | POA: Diagnosis not present

## 2019-10-24 DIAGNOSIS — Z31430 Encounter of female for testing for genetic disease carrier status for procreative management: Secondary | ICD-10-CM | POA: Diagnosis not present

## 2019-10-24 DIAGNOSIS — Z3490 Encounter for supervision of normal pregnancy, unspecified, unspecified trimester: Secondary | ICD-10-CM | POA: Diagnosis not present

## 2019-10-24 DIAGNOSIS — Z3401 Encounter for supervision of normal first pregnancy, first trimester: Secondary | ICD-10-CM | POA: Diagnosis not present

## 2019-11-25 ENCOUNTER — Telehealth: Payer: Self-pay | Admitting: Family

## 2019-11-25 DIAGNOSIS — K219 Gastro-esophageal reflux disease without esophagitis: Secondary | ICD-10-CM

## 2019-11-25 NOTE — Telephone Encounter (Signed)
Pt states that she needs a rx that is similar to  omeprazole (PRILOSEC) 40 MG capsule. She states that her insurance no longer covers it.

## 2019-11-26 MED ORDER — OMEPRAZOLE 40 MG PO CPDR: DELAYED_RELEASE_CAPSULE | ORAL | 0 refills | Status: AC

## 2019-11-26 NOTE — Telephone Encounter (Signed)
I have sent patient mychart  to call to schedule appointment.

## 2019-11-26 NOTE — Telephone Encounter (Signed)
I have sent patient info through mychart. Patient stated that she had a problem with not taking the 40mg . She tried doing the OTC 20mg  yesterday & she dealt with heartburn all day. She cannot take pepcid due to it causing her stomach upset.

## 2019-11-26 NOTE — Telephone Encounter (Signed)
Call pt Advise to make a f/u in the next couple of months with so we can discuss gerd and management thereof

## 2019-11-26 NOTE — Telephone Encounter (Signed)
Call pt Please ensure she is aware that I intended prilosec 40mg  ( high dose) was intended for short course use.  I would advise trial of pepcid ac which is otc as safer long term profile.  Please mail the below to here in regards to [place] term use beyond 3 months of proton pump inhibitors , also called PPI's, is associated with malabsorption of vitamins, chronic kidney disease, fracture risk, and diarrheal illnesses. PPI's include Nexium, Prilosec, Protonix, Dexilant, and Prevacid.   I generally recommend trying to control acid reflux with lifestyle modifications including avoiding trigger foods, not eating 2 hours prior to bedtime. You may use histamine 2 blockers daily to twice daily ( this is Pepcid) and then when symptoms flare, start back on PPI for short course. PLEASE NOTE: Zantac is a histamine blocker like Pepcid and the FDA has requested that Zantac be taken OFF THE MARKET. Please do NOT take this medication as there are concerns that it is cancer causing.   Of note, we will need to do an endoscopy ( upper GI) to evaluate your esophagus, stomach in the future if acid reflux persists are you develop red flag symptoms: trouble swallowing, hoarseness, chronic cough, unexplained weight loss.

## 2019-12-03 DIAGNOSIS — O368322 Maternal care for abnormalities of the fetal heart rate or rhythm, second trimester, fetus 2: Secondary | ICD-10-CM | POA: Diagnosis not present

## 2019-12-03 DIAGNOSIS — O30002 Twin pregnancy, unspecified number of placenta and unspecified number of amniotic sacs, second trimester: Secondary | ICD-10-CM | POA: Diagnosis not present

## 2019-12-03 DIAGNOSIS — Z3A16 16 weeks gestation of pregnancy: Secondary | ICD-10-CM | POA: Diagnosis not present

## 2019-12-03 DIAGNOSIS — O368321 Maternal care for abnormalities of the fetal heart rate or rhythm, second trimester, fetus 1: Secondary | ICD-10-CM | POA: Diagnosis not present

## 2019-12-14 IMAGING — MR MR HEAD WO/W CM
12 of 18 series · 26 of 48 positions shown · IV contrast (gadavist)
Comparison: None.

CLINICAL DATA: Headache and blurry vision for 2 months. Elevated
prolactin levels.

EXAM:
MRI HEAD WITHOUT AND WITH CONTRAST
TECHNIQUE: Multiplanar, multiecho pulse sequences of the brain and surrounding
structures were obtained without and with intravenous contrast.
CONTRAST:  90 cc Gadavist

[Series 5: T1 · sagittal · 5.0mm · 0.62mm/px · 1 of 23 slices shown]
[im 1/23]
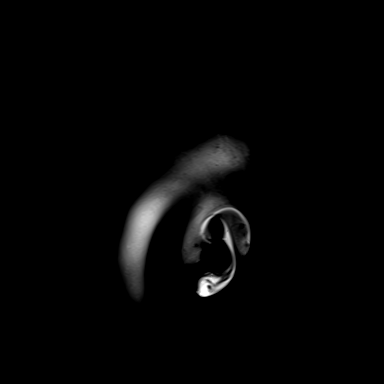

[Series 8: T2 · axial · 5.0mm · 0.53mm/px · z∈[-8,+136]mm · 2 of 25 slices shown]
[im 1/25]
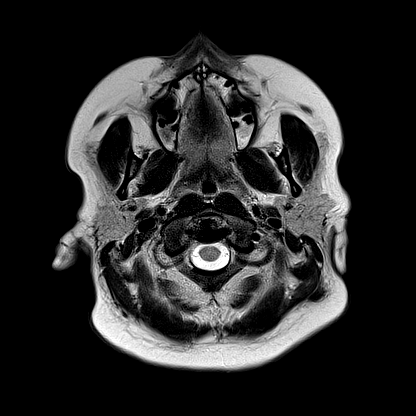
[im 25/25]
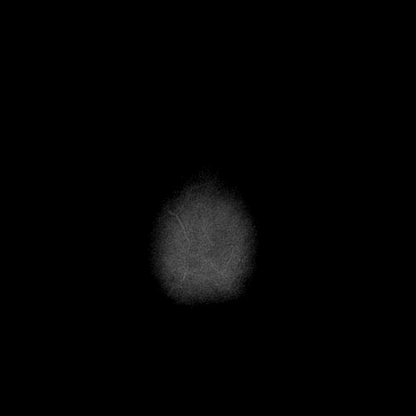

[Series 10: FLAIR · axial · 3.0mm · 0.53mm/px · z∈[-17,+145]mm · 5 of 55 slices shown]
[im 1/55]
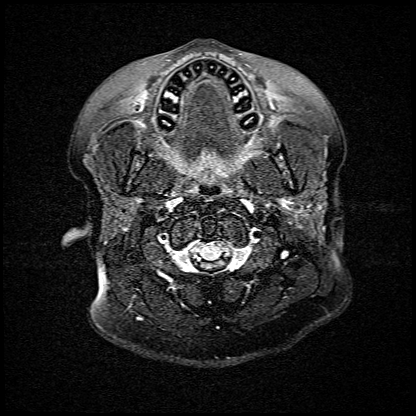
[im 14/55]
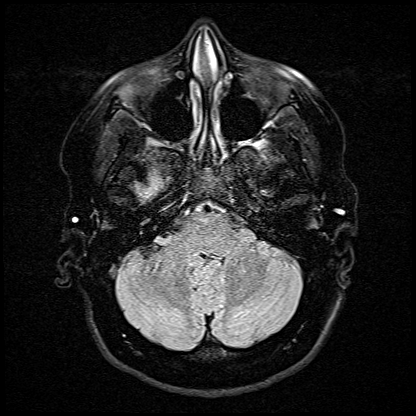
[im 28/55]
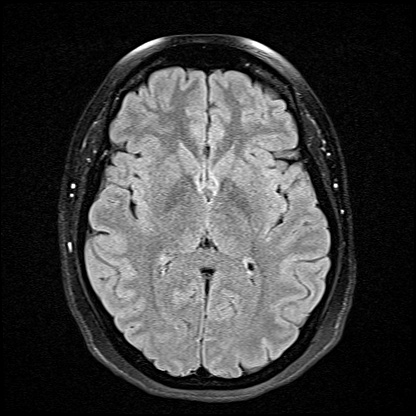
[im 41/55]
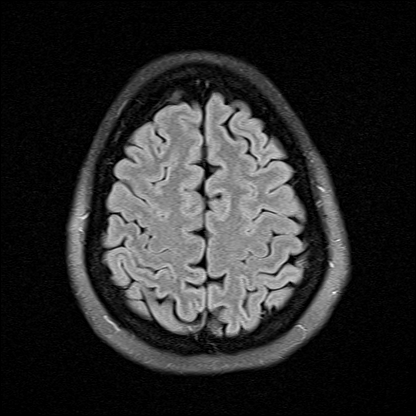
[im 55/55]
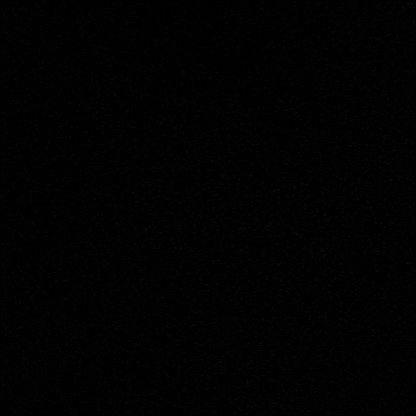

[Series 14: T1 post-contrast · coronal · 3.0mm · 0.28mm/px · 1 of 9 slices shown (1 of 9)]
[im 1/9]
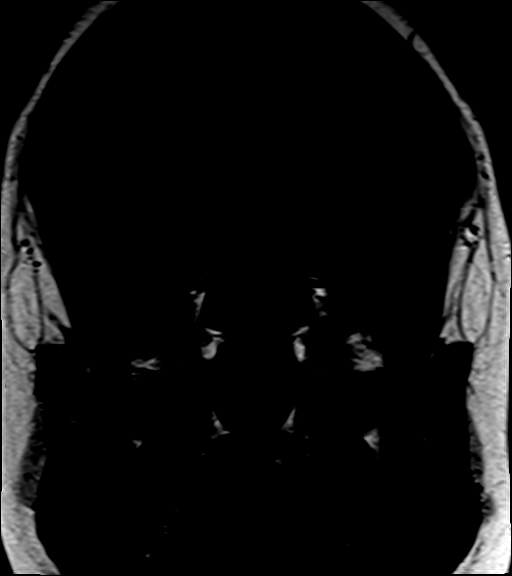

[Series 15: T1 post-contrast · coronal · 3.0mm · 0.28mm/px · 1 of 9 slices shown (2 of 9)]
[im 1/9]
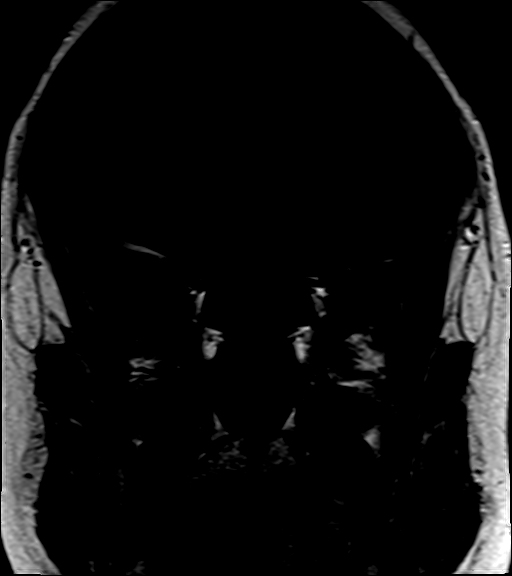

[Series 16: T1 post-contrast · coronal · 3.0mm · 0.28mm/px · 1 of 9 slices shown (3 of 9)]
[im 1/9]
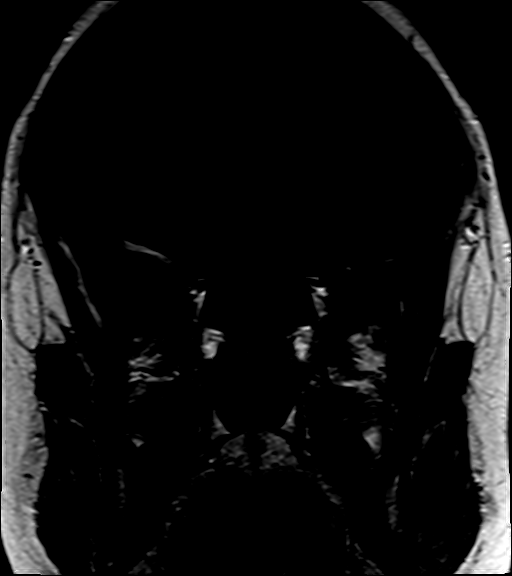

[Series 17: T1 post-contrast · coronal · 3.0mm · 0.28mm/px · 1 of 9 slices shown (4 of 9)]
[im 1/9]
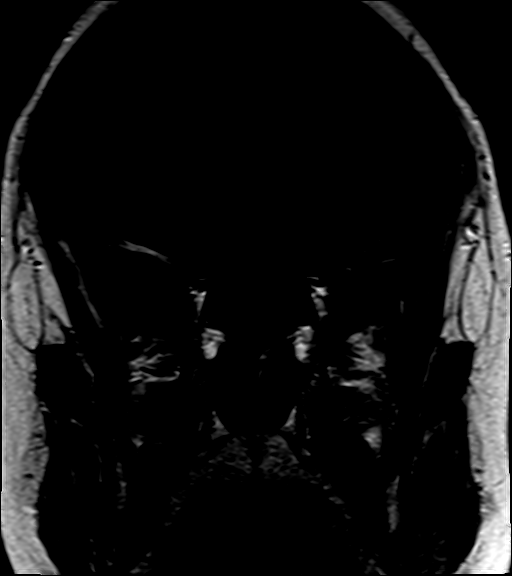

[Series 18: T1 post-contrast · coronal · 3.0mm · 0.28mm/px · 1 of 9 slices shown (5 of 9)]
[im 1/9]
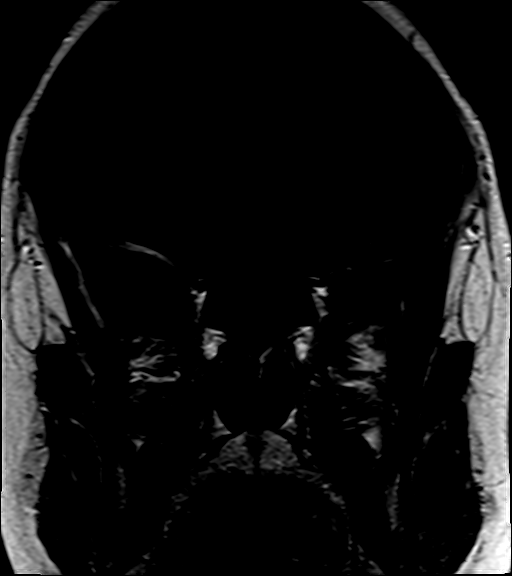

[Series 19: T1 post-contrast · coronal · 3.0mm · 0.21mm/px · 1 of 13 slices shown (6 of 9)]
[im 1/13]
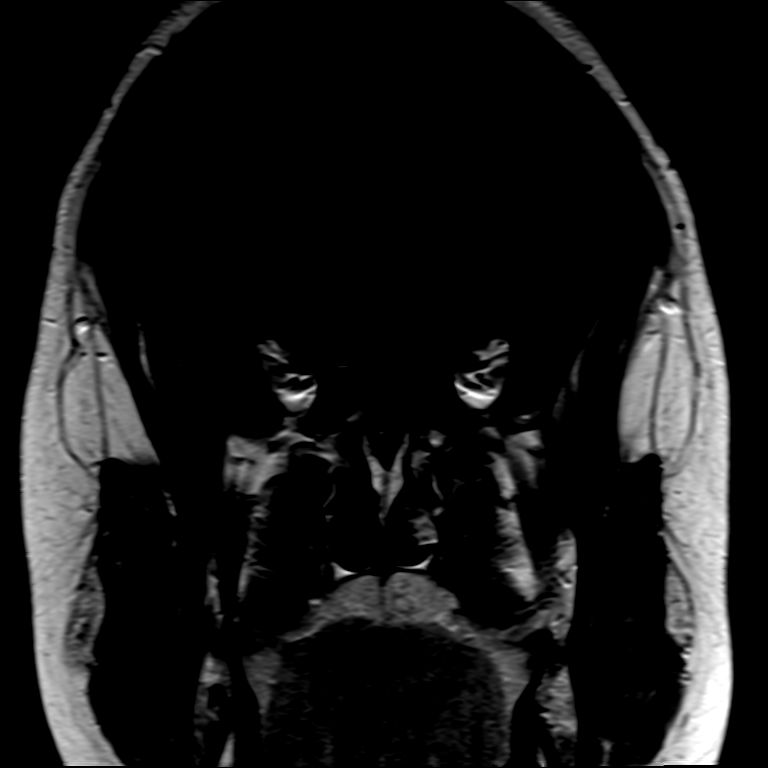

[Series 20: T1 post-contrast · sagittal · 3.0mm · 0.21mm/px · 1 of 13 slices shown (7 of 9)]
[im 1/13]
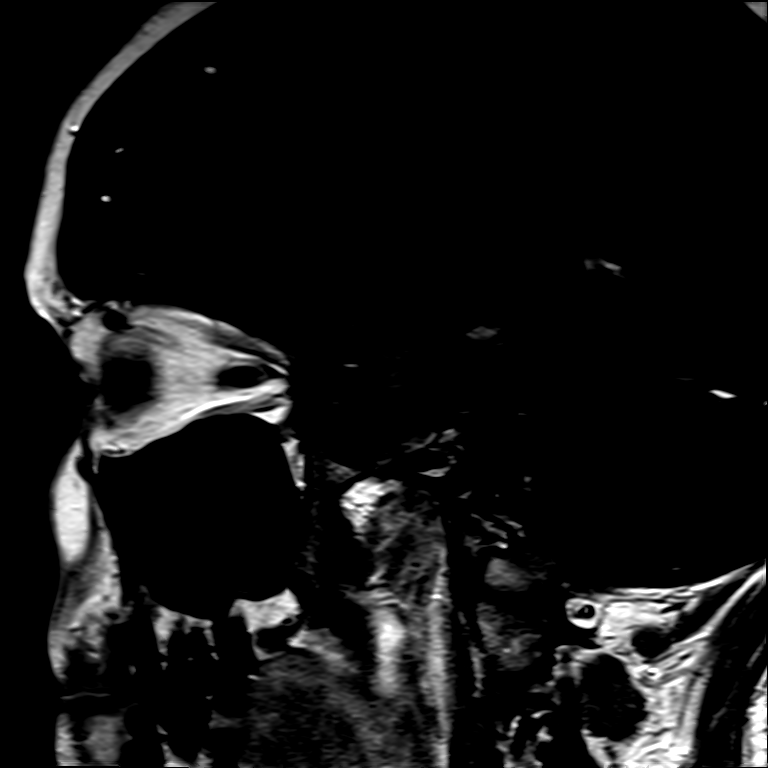

[Series 21: T1 post-contrast · axial · 1.0mm · 0.98mm/px · z∈[-22,+150]mm · 8 of 172 slices shown (8 of 9)]
[im 1/172]
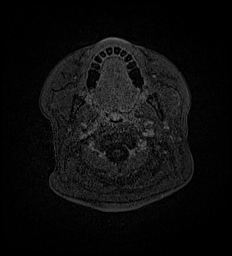
[im 25/172]
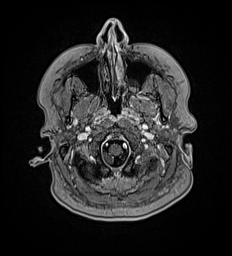
[im 49/172]
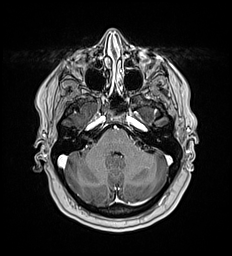
[im 74/172]
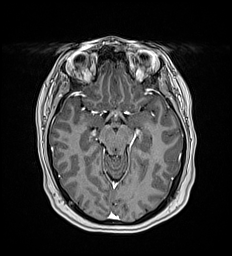
[im 98/172]
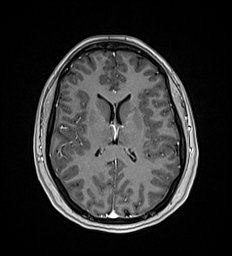
[im 123/172]
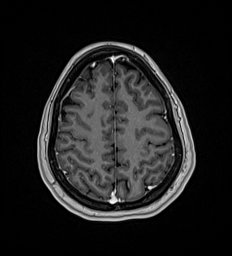
[im 147/172]
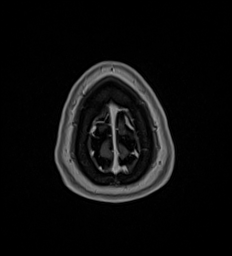
[im 172/172]
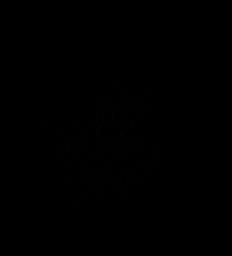

[Series 22: T1 post-contrast · coronal · 5.0mm · 0.57mm/px · 3 of 29 slices shown (9 of 9)]
[im 1/29]
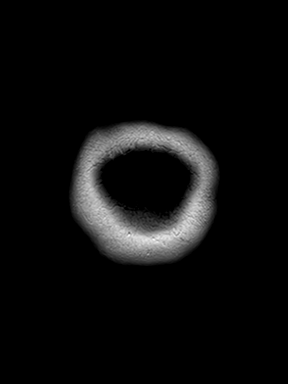
[im 15/29]
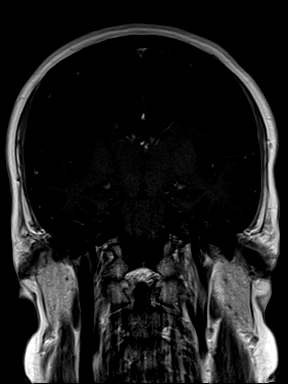
[im 29/29]
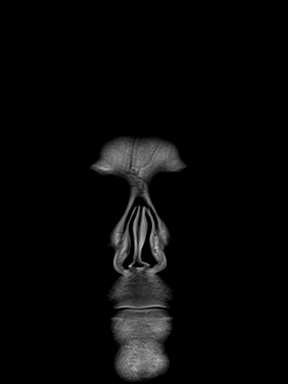

[26 of 48 positions shown; findings below may reference images not displayed]

FINDINGS: SELLA: No abnormal sellar expansion. Small displaced superior
posterior pituitary bright spot. Homogeneously nonenhancing cyst
within central pituitary gland with thin rind of normal peripheral
pituitary gland enhancement. Cyst is 9 x 14 x 15 mm (AP by
transverse by cc) extending into suprasellar cistern resulting in
buckled and foreshortened pituitary infundibulum. Symmetric
appearance of the cavernous sinuses and cavernous sinus flow voids.
Cyst mildly indents the undersurface of the optic chiasm.

INTRACRANIAL CONTENTS: No reduced diffusion to suggest acute
ischemia, hyperacute demyelination or hypercellular tumor. No
parenchymal brain volume loss for age. No hydrocephalus. No
suspicious parenchymal signal, mass lesions, mass effect. No
abnormal intraparenchymal or extra-axial enhancement. No abnormal
extra-axial fluid collections.

VASCULAR: Normal major intracranial vascular flow voids present at
skull base.

ORBITS: The ocular globes and orbital contents are non-suspicious.

SINUSES: LEFT maxillary mucosal retention cyst. Mastoid air cells
are well aerated.

SKULL/SOFT TISSUES: No suspicious calvarial bone marrow signal.
Craniocervical junction maintained.
IMPRESSION: 1. 9 x 14 x 15 mm Rathke's cleft cyst. Cyst mildly indents the
undersurface of optic chiasm.
2. Otherwise unremarkable MRI of the head with and without contrast.

## 2019-12-17 DIAGNOSIS — O4442 Low lying placenta NOS or without hemorrhage, second trimester: Secondary | ICD-10-CM | POA: Diagnosis not present

## 2019-12-17 DIAGNOSIS — Z3A18 18 weeks gestation of pregnancy: Secondary | ICD-10-CM | POA: Diagnosis not present

## 2019-12-17 DIAGNOSIS — O30001 Twin pregnancy, unspecified number of placenta and unspecified number of amniotic sacs, first trimester: Secondary | ICD-10-CM | POA: Diagnosis not present

## 2020-01-05 ENCOUNTER — Telehealth: Payer: Self-pay | Admitting: Family

## 2020-01-05 NOTE — Telephone Encounter (Signed)
Rejection Reason - Patient Declined - patient cx on 01/01/11 - declined to R/S" [facility] PA said on Jan 05, 2020 10:27 AM

## 2020-01-28 DIAGNOSIS — Z3402 Encounter for supervision of normal first pregnancy, second trimester: Secondary | ICD-10-CM | POA: Diagnosis not present

## 2020-02-13 DIAGNOSIS — Z3402 Encounter for supervision of normal first pregnancy, second trimester: Secondary | ICD-10-CM | POA: Diagnosis not present

## 2020-02-16 DIAGNOSIS — L74510 Primary focal hyperhidrosis, axilla: Secondary | ICD-10-CM | POA: Diagnosis not present

## 2020-02-16 DIAGNOSIS — R21 Rash and other nonspecific skin eruption: Secondary | ICD-10-CM | POA: Diagnosis not present

## 2020-02-16 DIAGNOSIS — J302 Other seasonal allergic rhinitis: Secondary | ICD-10-CM | POA: Diagnosis not present

## 2020-02-25 DIAGNOSIS — O30043 Twin pregnancy, dichorionic/diamniotic, third trimester: Secondary | ICD-10-CM | POA: Diagnosis not present

## 2020-02-25 DIAGNOSIS — O358XX1 Maternal care for other (suspected) fetal abnormality and damage, fetus 1: Secondary | ICD-10-CM | POA: Diagnosis not present

## 2020-02-25 DIAGNOSIS — O30001 Twin pregnancy, unspecified number of placenta and unspecified number of amniotic sacs, first trimester: Secondary | ICD-10-CM | POA: Diagnosis not present

## 2020-02-25 DIAGNOSIS — Z3689 Encounter for other specified antenatal screening: Secondary | ICD-10-CM | POA: Diagnosis not present

## 2020-02-25 DIAGNOSIS — O4443 Low lying placenta NOS or without hemorrhage, third trimester: Secondary | ICD-10-CM | POA: Diagnosis not present

## 2020-02-25 DIAGNOSIS — O4442 Low lying placenta NOS or without hemorrhage, second trimester: Secondary | ICD-10-CM | POA: Diagnosis not present

## 2020-02-25 DIAGNOSIS — Z3A28 28 weeks gestation of pregnancy: Secondary | ICD-10-CM | POA: Diagnosis not present

## 2020-03-01 DIAGNOSIS — Z3A29 29 weeks gestation of pregnancy: Secondary | ICD-10-CM | POA: Diagnosis not present

## 2020-03-01 DIAGNOSIS — Z23 Encounter for immunization: Secondary | ICD-10-CM | POA: Diagnosis not present

## 2020-03-01 DIAGNOSIS — O30049 Twin pregnancy, dichorionic/diamniotic, unspecified trimester: Secondary | ICD-10-CM | POA: Diagnosis not present

## 2020-03-01 DIAGNOSIS — Z3403 Encounter for supervision of normal first pregnancy, third trimester: Secondary | ICD-10-CM | POA: Diagnosis not present

## 2020-03-17 DIAGNOSIS — Z3A31 31 weeks gestation of pregnancy: Secondary | ICD-10-CM | POA: Diagnosis not present

## 2020-03-17 DIAGNOSIS — J310 Chronic rhinitis: Secondary | ICD-10-CM | POA: Diagnosis not present

## 2020-03-17 DIAGNOSIS — J3489 Other specified disorders of nose and nasal sinuses: Secondary | ICD-10-CM | POA: Diagnosis not present

## 2020-03-17 DIAGNOSIS — Z3403 Encounter for supervision of normal first pregnancy, third trimester: Secondary | ICD-10-CM | POA: Diagnosis not present

## 2020-03-22 DIAGNOSIS — R609 Edema, unspecified: Secondary | ICD-10-CM | POA: Diagnosis not present

## 2020-03-24 DIAGNOSIS — Z3403 Encounter for supervision of normal first pregnancy, third trimester: Secondary | ICD-10-CM | POA: Diagnosis not present

## 2020-03-24 DIAGNOSIS — Z3689 Encounter for other specified antenatal screening: Secondary | ICD-10-CM | POA: Diagnosis not present

## 2020-03-24 DIAGNOSIS — O358XX0 Maternal care for other (suspected) fetal abnormality and damage, not applicable or unspecified: Secondary | ICD-10-CM | POA: Diagnosis not present

## 2020-03-24 DIAGNOSIS — O30043 Twin pregnancy, dichorionic/diamniotic, third trimester: Secondary | ICD-10-CM | POA: Diagnosis not present

## 2020-03-24 DIAGNOSIS — Z3A32 32 weeks gestation of pregnancy: Secondary | ICD-10-CM | POA: Diagnosis not present

## 2020-03-31 DIAGNOSIS — O99891 Other specified diseases and conditions complicating pregnancy: Secondary | ICD-10-CM | POA: Diagnosis not present

## 2020-03-31 DIAGNOSIS — O10913 Unspecified pre-existing hypertension complicating pregnancy, third trimester: Secondary | ICD-10-CM | POA: Diagnosis not present

## 2020-03-31 DIAGNOSIS — Z3A33 33 weeks gestation of pregnancy: Secondary | ICD-10-CM | POA: Diagnosis not present

## 2020-03-31 DIAGNOSIS — R030 Elevated blood-pressure reading, without diagnosis of hypertension: Secondary | ICD-10-CM | POA: Diagnosis not present

## 2020-03-31 DIAGNOSIS — O26893 Other specified pregnancy related conditions, third trimester: Secondary | ICD-10-CM | POA: Diagnosis not present

## 2020-03-31 DIAGNOSIS — O30043 Twin pregnancy, dichorionic/diamniotic, third trimester: Secondary | ICD-10-CM | POA: Diagnosis not present

## 2020-03-31 DIAGNOSIS — R197 Diarrhea, unspecified: Secondary | ICD-10-CM | POA: Diagnosis not present

## 2020-03-31 DIAGNOSIS — O99213 Obesity complicating pregnancy, third trimester: Secondary | ICD-10-CM | POA: Diagnosis not present

## 2020-03-31 DIAGNOSIS — R2243 Localized swelling, mass and lump, lower limb, bilateral: Secondary | ICD-10-CM | POA: Diagnosis not present

## 2020-03-31 DIAGNOSIS — R519 Headache, unspecified: Secondary | ICD-10-CM | POA: Diagnosis not present

## 2020-03-31 DIAGNOSIS — O30049 Twin pregnancy, dichorionic/diamniotic, unspecified trimester: Secondary | ICD-10-CM | POA: Diagnosis not present

## 2020-03-31 DIAGNOSIS — R1010 Upper abdominal pain, unspecified: Secondary | ICD-10-CM | POA: Diagnosis not present

## 2020-04-01 DIAGNOSIS — Z20822 Contact with and (suspected) exposure to covid-19: Secondary | ICD-10-CM | POA: Diagnosis not present

## 2020-04-01 DIAGNOSIS — O30043 Twin pregnancy, dichorionic/diamniotic, third trimester: Secondary | ICD-10-CM | POA: Diagnosis not present

## 2020-04-01 DIAGNOSIS — Z3A33 33 weeks gestation of pregnancy: Secondary | ICD-10-CM | POA: Diagnosis not present

## 2020-04-01 DIAGNOSIS — O99213 Obesity complicating pregnancy, third trimester: Secondary | ICD-10-CM | POA: Diagnosis not present

## 2020-04-01 DIAGNOSIS — O1213 Gestational proteinuria, third trimester: Secondary | ICD-10-CM | POA: Diagnosis not present

## 2020-04-01 DIAGNOSIS — R197 Diarrhea, unspecified: Secondary | ICD-10-CM | POA: Diagnosis not present

## 2020-04-01 DIAGNOSIS — O99613 Diseases of the digestive system complicating pregnancy, third trimester: Secondary | ICD-10-CM | POA: Diagnosis not present

## 2020-04-01 DIAGNOSIS — O99891 Other specified diseases and conditions complicating pregnancy: Secondary | ICD-10-CM | POA: Diagnosis not present

## 2020-04-01 DIAGNOSIS — O10913 Unspecified pre-existing hypertension complicating pregnancy, third trimester: Secondary | ICD-10-CM | POA: Diagnosis not present

## 2020-04-01 DIAGNOSIS — R519 Headache, unspecified: Secondary | ICD-10-CM | POA: Diagnosis not present

## 2020-04-01 DIAGNOSIS — O30049 Twin pregnancy, dichorionic/diamniotic, unspecified trimester: Secondary | ICD-10-CM | POA: Diagnosis not present

## 2020-04-01 DIAGNOSIS — K219 Gastro-esophageal reflux disease without esophagitis: Secondary | ICD-10-CM | POA: Diagnosis not present

## 2020-04-01 DIAGNOSIS — R2243 Localized swelling, mass and lump, lower limb, bilateral: Secondary | ICD-10-CM | POA: Diagnosis not present

## 2020-04-01 DIAGNOSIS — O1413 Severe pre-eclampsia, third trimester: Secondary | ICD-10-CM | POA: Diagnosis not present

## 2020-04-01 DIAGNOSIS — O1493 Unspecified pre-eclampsia, third trimester: Secondary | ICD-10-CM | POA: Diagnosis not present

## 2020-04-01 DIAGNOSIS — R1010 Upper abdominal pain, unspecified: Secondary | ICD-10-CM | POA: Diagnosis not present

## 2020-04-01 DIAGNOSIS — Z79899 Other long term (current) drug therapy: Secondary | ICD-10-CM | POA: Diagnosis not present

## 2020-04-08 DIAGNOSIS — O1493 Unspecified pre-eclampsia, third trimester: Secondary | ICD-10-CM | POA: Diagnosis not present

## 2020-04-08 DIAGNOSIS — O1213 Gestational proteinuria, third trimester: Secondary | ICD-10-CM | POA: Diagnosis not present

## 2020-04-08 DIAGNOSIS — D497 Neoplasm of unspecified behavior of endocrine glands and other parts of nervous system: Secondary | ICD-10-CM | POA: Diagnosis not present

## 2020-04-08 DIAGNOSIS — O30043 Twin pregnancy, dichorionic/diamniotic, third trimester: Secondary | ICD-10-CM | POA: Diagnosis not present

## 2020-04-08 DIAGNOSIS — O358XX1 Maternal care for other (suspected) fetal abnormality and damage, fetus 1: Secondary | ICD-10-CM | POA: Diagnosis not present

## 2020-04-08 DIAGNOSIS — Z79899 Other long term (current) drug therapy: Secondary | ICD-10-CM | POA: Diagnosis not present

## 2020-04-08 DIAGNOSIS — O1413 Severe pre-eclampsia, third trimester: Secondary | ICD-10-CM | POA: Diagnosis not present

## 2020-04-08 DIAGNOSIS — O358XX2 Maternal care for other (suspected) fetal abnormality and damage, fetus 2: Secondary | ICD-10-CM | POA: Diagnosis not present

## 2020-04-08 DIAGNOSIS — O30049 Twin pregnancy, dichorionic/diamniotic, unspecified trimester: Secondary | ICD-10-CM | POA: Diagnosis not present

## 2020-04-08 DIAGNOSIS — Z3A34 34 weeks gestation of pregnancy: Secondary | ICD-10-CM | POA: Diagnosis not present

## 2020-04-08 DIAGNOSIS — O99283 Endocrine, nutritional and metabolic diseases complicating pregnancy, third trimester: Secondary | ICD-10-CM | POA: Diagnosis not present

## 2020-04-12 DIAGNOSIS — R918 Other nonspecific abnormal finding of lung field: Secondary | ICD-10-CM | POA: Diagnosis not present

## 2020-04-12 DIAGNOSIS — D62 Acute posthemorrhagic anemia: Secondary | ICD-10-CM | POA: Diagnosis not present

## 2020-04-12 DIAGNOSIS — O321XX2 Maternal care for breech presentation, fetus 2: Secondary | ICD-10-CM | POA: Diagnosis not present

## 2020-04-12 DIAGNOSIS — Z3A35 35 weeks gestation of pregnancy: Secondary | ICD-10-CM | POA: Diagnosis not present

## 2020-04-12 DIAGNOSIS — R011 Cardiac murmur, unspecified: Secondary | ICD-10-CM | POA: Diagnosis not present

## 2020-04-12 DIAGNOSIS — O99893 Other specified diseases and conditions complicating puerperium: Secondary | ICD-10-CM | POA: Diagnosis not present

## 2020-04-12 DIAGNOSIS — J302 Other seasonal allergic rhinitis: Secondary | ICD-10-CM | POA: Diagnosis not present

## 2020-04-12 DIAGNOSIS — R6819 Other nonspecific symptoms peculiar to infancy: Secondary | ICD-10-CM | POA: Diagnosis not present

## 2020-04-12 DIAGNOSIS — O30043 Twin pregnancy, dichorionic/diamniotic, third trimester: Secondary | ICD-10-CM | POA: Diagnosis not present

## 2020-04-12 DIAGNOSIS — K219 Gastro-esophageal reflux disease without esophagitis: Secondary | ICD-10-CM | POA: Diagnosis not present

## 2020-04-12 DIAGNOSIS — O9962 Diseases of the digestive system complicating childbirth: Secondary | ICD-10-CM | POA: Diagnosis not present

## 2020-04-12 DIAGNOSIS — O9952 Diseases of the respiratory system complicating childbirth: Secondary | ICD-10-CM | POA: Diagnosis not present

## 2020-04-12 DIAGNOSIS — Z20822 Contact with and (suspected) exposure to covid-19: Secondary | ICD-10-CM | POA: Diagnosis not present

## 2020-04-12 DIAGNOSIS — Z79899 Other long term (current) drug therapy: Secondary | ICD-10-CM | POA: Diagnosis not present

## 2020-04-12 DIAGNOSIS — O30049 Twin pregnancy, dichorionic/diamniotic, unspecified trimester: Secondary | ICD-10-CM | POA: Diagnosis not present

## 2020-04-12 DIAGNOSIS — O1414 Severe pre-eclampsia complicating childbirth: Secondary | ICD-10-CM | POA: Diagnosis not present

## 2020-04-12 DIAGNOSIS — R600 Localized edema: Secondary | ICD-10-CM | POA: Diagnosis not present

## 2020-04-12 DIAGNOSIS — O1493 Unspecified pre-eclampsia, third trimester: Secondary | ICD-10-CM | POA: Diagnosis not present

## 2020-04-12 DIAGNOSIS — M7989 Other specified soft tissue disorders: Secondary | ICD-10-CM | POA: Diagnosis not present

## 2020-04-12 DIAGNOSIS — G4452 New daily persistent headache (NDPH): Secondary | ICD-10-CM | POA: Diagnosis not present

## 2020-04-18 DIAGNOSIS — R011 Cardiac murmur, unspecified: Secondary | ICD-10-CM | POA: Diagnosis not present

## 2020-04-24 DIAGNOSIS — Q210 Ventricular septal defect: Secondary | ICD-10-CM | POA: Diagnosis not present

## 2020-04-25 DIAGNOSIS — Q210 Ventricular septal defect: Secondary | ICD-10-CM | POA: Diagnosis not present

## 2020-05-11 DIAGNOSIS — Z00129 Encounter for routine child health examination without abnormal findings: Secondary | ICD-10-CM | POA: Diagnosis not present

## 2020-05-25 DIAGNOSIS — L740 Miliaria rubra: Secondary | ICD-10-CM | POA: Diagnosis not present

## 2020-05-25 DIAGNOSIS — Q210 Ventricular septal defect: Secondary | ICD-10-CM | POA: Diagnosis not present

## 2020-05-27 DIAGNOSIS — Z30430 Encounter for insertion of intrauterine contraceptive device: Secondary | ICD-10-CM | POA: Diagnosis not present

## 2020-06-09 DIAGNOSIS — Q210 Ventricular septal defect: Secondary | ICD-10-CM | POA: Diagnosis not present

## 2020-07-08 DIAGNOSIS — L719 Rosacea, unspecified: Secondary | ICD-10-CM | POA: Diagnosis not present

## 2020-07-08 DIAGNOSIS — L74510 Primary focal hyperhidrosis, axilla: Secondary | ICD-10-CM | POA: Diagnosis not present

## 2021-08-15 ENCOUNTER — Ambulatory Visit
Admission: RE | Admit: 2021-08-15 | Discharge: 2021-08-15 | Disposition: A | Discharge status: Home / Self Care | Payer: BC Managed Care – PPO | Source: Ambulatory Visit | Attending: Emergency Medicine | Admitting: Emergency Medicine

## 2021-08-15 VITALS — BP 136/93 | HR 82 | Temp 98.2°F | Resp 16

## 2021-08-15 DIAGNOSIS — L237 Allergic contact dermatitis due to plants, except food: Secondary | ICD-10-CM

## 2021-08-15 MED ORDER — PREDNISONE 10 MG (21) PO TBPK: ORAL_TABLET | Freq: Every day | ORAL | 0 refills | Status: AC

## 2021-08-15 NOTE — ED Triage Notes (Signed)
Pt presents with poison oak all over body since yesterday.

## 2021-08-15 NOTE — ED Provider Notes (Signed)
Debra Wood    CSN: [id number] Arrival date & time: 08/15/21  0945      History   Chief Complaint Chief Complaint  Patient presents with   Poison Ivy    All over arms and legs - Entered by patient    HPI Debra Wood is a 31 y.o. female.   Patient presents with rash to the bilateral arms, bilateral legs, trunk and face for 2 days.  Symptoms initially started off as generalized pruritus then rash began 1 day later.  Known contact to poison ivy/oak while clearing brush from yard.  Has attempted use of calamine lotion, hydrocortisone and Benadryl which has been ineffective.  Denies fever, chills or drainage.  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   Pituitary tumor 08/26/2018   GERD (gastroesophageal reflux disease) 08/26/2018   BMI 33.0-33.9,adult 08/26/2018   Trochanteric bursitis of right hip 07/04/2017   Obesity (BMI 30.0-34.9) 04/16/2015    Past Surgical History:  Procedure Laterality Date   PITUITARY SURGERY  08/12/2018   Dr Tennis Must and Dr Burnard Bunting   TONSILLECTOMY AND ADENOIDECTOMY  2009    OB History     Gravida  1   Para      Term      Preterm      AB      Living         SAB      IAB      Ectopic      Multiple      Live Births               Home Medications    Prior to Admission medications   Medication Sig Start Date End Date Taking? Authorizing Provider  predniSONE (STERAPRED UNI-PAK 21 TAB) 10 MG (21) TBPK tablet Take by mouth daily. Take 6 tabs by mouth daily  for 2 days, then 5 tabs for 2 days, then 4 tabs for 2 days, then 3 tabs for 2 days, 2 tabs for 2 days, then 1 tab by mouth daily for 2 days 08/15/21  Yes Gurnie Duris R, NP  azelastine (ASTELIN) 0.1 % nasal spray Place 1 spray into both nostrils 2 (two) times daily.  02/04/19 02/04/20  [provider]   fexofenadine (ALLEGRA) 180 MG tablet TAKE 1 TABLET BY MOUTH EVERY DAY 10/01/19   Burnard Hawthorne, FNP  mometasone (NASONEX) 50 MCG/ACT nasal spray Place 2 sprays into the nose daily.    [provider]  omeprazole (PRILOSEC) 40 MG capsule TAKE 1 CAPSULE BY MOUTH DAILY AS NEEDED for no more than 6 weeks. 11/26/19   Burnard Hawthorne, FNP    Family History Family History  Problem Relation Age of Onset   Breast cancer Maternal Grandmother        40's-50's pt unsure   Breast cancer Paternal Grandmother 59       had 2 seperate times    Social History Social History   Tobacco Use   Smoking status: Never   Smokeless tobacco: Never  Vaping Use   Vaping Use: Never used  Substance Use Topics   Alcohol use: Not Currently    Alcohol/week: 2.0 - 3.0 standard drinks    Types: 2 - 3 Standard drinks or equivalent per week   Drug use: No  Allergies   Patient has no known allergies.   Review of Systems Review of Systems  Constitutional: Negative.   Respiratory: Negative.    Cardiovascular: Negative.   Skin:  Positive for rash. Negative for color change, pallor and wound.  Neurological: Negative.     Physical Exam Triage Vital Signs ED Triage Vitals  Enc Vitals Group     BP 08/15/21 0959 (!) 136/93     Pulse Rate 08/15/21 0959 82     Resp 08/15/21 0959 16     Temp 08/15/21 0959 98.2 F (36.8 C)     Temp Source 08/15/21 0959 Oral     SpO2 08/15/21 0959 98 %     Weight --      Height --      Head Circumference --      Peak Flow --      Pain Score 08/15/21 1001 6     Pain Loc --      Pain Edu? --      Excl. in [place]? --    No data found.  Updated Vital Signs BP (!) 136/93 (BP Location: Left Arm)   Pulse 82   Temp 98.2 F (36.8 C) (Oral)   Resp 16   LMP 08/08/2021   SpO2 98%   Visual Acuity Right Eye Distance:   Left Eye Distance:   Bilateral Distance:    Right Eye Near:   Left Eye Near:    Bilateral Near:     Physical Exam Constitutional:       Appearance: Normal appearance.  HENT:     Head: Normocephalic.  Eyes:     Extraocular Movements: Extraocular movements intact.  Skin:    Comments: Erythematous papular blisterlike rash present to the bilateral upper and lower extremities, chest, abdomen, back and the bilateral cheeks  Neurological:     Mental Status: She is alert and oriented to person, place, and time. Mental status is at baseline.  Psychiatric:        Mood and Affect: Mood normal.        Behavior: Behavior normal.     UC Treatments / Results  Labs (all labs ordered are listed, but only abnormal results are displayed) Labs Reviewed - No data to display  EKG   Radiology No results found.  Procedures Procedures (including critical care time)  Medications Ordered in UC Medications - No data to display  Initial Impression / Assessment and Plan / UC Course  I have reviewed the triage vital signs and the nursing notes.  Pertinent labs & imaging results that were available during my care of the patient were reviewed by me and considered in my medical decision making (see chart for details).  Poison ivy dermatitis  Presentation of rash is consistent with poison ivy rash and patient has known exposure, prednisone 60 mg taper prescribed, may continue use of oral antihistamines and topical calamine lotion for management of pruritus, advised patient to avoid scratching to prevent secondary infection, given strict precautions for persisting or worsening symptoms to follow-up for reevaluation Final Clinical Impressions(s) / UC Diagnoses   Final diagnoses:  Poison ivy dermatitis     Discharge Instructions      Today you are being treated for poison ivy rash  Take prednisone every morning with food as directed on packaging  You may use Claritin, Zyrtec or Benadryl for management of itching, be mindful Benadryl may make you drowsy  You may use topical Benadryl cream or calamine lotion in addition  for  comfort  Please attempt to not to scratch areas as breaks in skin will increase your risk for secondary infection  May follow-up with urgent care as needed for reevaluation if redness continues to persist or worsens   ED Prescriptions     Medication Sig Dispense Auth. Provider   predniSONE (STERAPRED UNI-PAK 21 TAB) 10 MG (21) TBPK tablet Take by mouth daily. Take 6 tabs by mouth daily  for 2 days, then 5 tabs for 2 days, then 4 tabs for 2 days, then 3 tabs for 2 days, 2 tabs for 2 days, then 1 tab by mouth daily for 2 days 42 tablet Nichlas Pitera, Leitha Schuller, NP      PDMP not reviewed this encounter.   Hans Eden, NP 08/15/21 1014

## 2021-08-15 NOTE — Discharge Instructions (Addendum)
Today you are being treated for poison ivy rash  Take prednisone every morning with food as directed on packaging  You may use Claritin, Zyrtec or Benadryl for management of itching, be mindful Benadryl may make you drowsy  You may use topical Benadryl cream or calamine lotion in addition for comfort  Please attempt to not to scratch areas as breaks in skin will increase your risk for secondary infection  May follow-up with urgent care as needed for reevaluation if redness continues to persist or worsens
